# Patient Record
Sex: Female | Born: 1965 | Race: Black or African American | Hispanic: No | Smoking: Never smoker
Health system: Southern US, Community
[De-identification: ages and names within clinical notes are randomized; demographics above are authoritative.]

## PROBLEM LIST (undated history)

## (undated) DIAGNOSIS — E079 Disorder of thyroid, unspecified: Secondary | ICD-10-CM

## (undated) DIAGNOSIS — K5909 Other constipation: Secondary | ICD-10-CM

## (undated) DIAGNOSIS — R87619 Unspecified abnormal cytological findings in specimens from cervix uteri: Secondary | ICD-10-CM

## (undated) DIAGNOSIS — G43109 Migraine with aura, not intractable, without status migrainosus: Secondary | ICD-10-CM

## (undated) HISTORY — DX: Other constipation: K59.09

## (undated) HISTORY — DX: Migraine with aura, not intractable, without status migrainosus: G43.109

## (undated) HISTORY — DX: Disorder of thyroid, unspecified: E07.9

## (undated) HISTORY — DX: Unspecified abnormal cytological findings in specimens from cervix uteri: R87.619

---

## 1992-10-18 HISTORY — PX: DILATION AND CURETTAGE OF UTERUS: SHX78

## 1993-10-18 HISTORY — PX: TUBAL LIGATION: SHX77

## 1999-01-23 ENCOUNTER — Other Ambulatory Visit: Admission: RE | Admit: 1999-01-23 | Discharge: 1999-01-23 | Payer: Self-pay | Admitting: Obstetrics and Gynecology

## 1999-10-19 DIAGNOSIS — R87619 Unspecified abnormal cytological findings in specimens from cervix uteri: Secondary | ICD-10-CM

## 1999-10-19 HISTORY — PX: GYNECOLOGIC CRYOSURGERY: SHX857

## 1999-10-19 HISTORY — DX: Unspecified abnormal cytological findings in specimens from cervix uteri: R87.619

## 2000-02-24 ENCOUNTER — Other Ambulatory Visit: Admission: RE | Admit: 2000-02-24 | Discharge: 2000-02-24 | Payer: Self-pay | Admitting: *Deleted

## 2000-04-14 ENCOUNTER — Other Ambulatory Visit: Admission: RE | Admit: 2000-04-14 | Discharge: 2000-04-14 | Payer: Self-pay | Admitting: *Deleted

## 2000-04-21 ENCOUNTER — Encounter (INDEPENDENT_AMBULATORY_CARE_PROVIDER_SITE_OTHER): Payer: Self-pay

## 2000-04-21 ENCOUNTER — Other Ambulatory Visit: Admission: RE | Admit: 2000-04-21 | Discharge: 2000-04-21 | Payer: Self-pay | Admitting: *Deleted

## 2000-07-29 ENCOUNTER — Other Ambulatory Visit: Admission: RE | Admit: 2000-07-29 | Discharge: 2000-07-29 | Payer: Self-pay | Admitting: *Deleted

## 2001-01-20 ENCOUNTER — Other Ambulatory Visit: Admission: RE | Admit: 2001-01-20 | Discharge: 2001-01-20 | Payer: Self-pay | Admitting: *Deleted

## 2001-08-08 ENCOUNTER — Other Ambulatory Visit: Admission: RE | Admit: 2001-08-08 | Discharge: 2001-08-08 | Payer: Self-pay | Admitting: *Deleted

## 2002-03-21 ENCOUNTER — Other Ambulatory Visit: Admission: RE | Admit: 2002-03-21 | Discharge: 2002-03-21 | Payer: Self-pay | Admitting: *Deleted

## 2002-07-30 ENCOUNTER — Other Ambulatory Visit: Admission: RE | Admit: 2002-07-30 | Discharge: 2002-07-30 | Payer: Self-pay | Admitting: *Deleted

## 2002-10-18 DIAGNOSIS — E079 Disorder of thyroid, unspecified: Secondary | ICD-10-CM

## 2002-10-18 HISTORY — DX: Disorder of thyroid, unspecified: E07.9

## 2003-03-26 ENCOUNTER — Other Ambulatory Visit: Admission: RE | Admit: 2003-03-26 | Discharge: 2003-03-26 | Payer: Self-pay | Admitting: *Deleted

## 2004-04-29 ENCOUNTER — Other Ambulatory Visit: Admission: RE | Admit: 2004-04-29 | Discharge: 2004-04-29 | Payer: Self-pay | Admitting: *Deleted

## 2005-04-02 ENCOUNTER — Other Ambulatory Visit: Admission: RE | Admit: 2005-04-02 | Discharge: 2005-04-02 | Payer: Self-pay | Admitting: *Deleted

## 2005-08-10 ENCOUNTER — Encounter: Admission: RE | Admit: 2005-08-10 | Discharge: 2005-08-10 | Payer: Self-pay | Admitting: Gastroenterology

## 2006-03-30 ENCOUNTER — Other Ambulatory Visit: Admission: RE | Admit: 2006-03-30 | Discharge: 2006-03-30 | Payer: Self-pay | Admitting: *Deleted

## 2007-03-28 ENCOUNTER — Other Ambulatory Visit: Admission: RE | Admit: 2007-03-28 | Discharge: 2007-03-28 | Payer: Self-pay | Admitting: *Deleted

## 2008-04-22 ENCOUNTER — Other Ambulatory Visit: Admission: RE | Admit: 2008-04-22 | Discharge: 2008-04-22 | Payer: Self-pay | Admitting: Family Medicine

## 2008-04-29 ENCOUNTER — Encounter: Admission: RE | Admit: 2008-04-29 | Discharge: 2008-04-29 | Payer: Self-pay | Admitting: Family Medicine

## 2008-05-17 ENCOUNTER — Ambulatory Visit (HOSPITAL_COMMUNITY): Admission: RE | Admit: 2008-05-17 | Discharge: 2008-05-17 | Payer: Self-pay | Admitting: Family Medicine

## 2009-04-03 ENCOUNTER — Other Ambulatory Visit: Admission: RE | Admit: 2009-04-03 | Discharge: 2009-04-03 | Payer: Self-pay | Admitting: Family Medicine

## 2010-04-10 ENCOUNTER — Other Ambulatory Visit: Admission: RE | Admit: 2010-04-10 | Discharge: 2010-04-10 | Payer: Self-pay | Admitting: Family Medicine

## 2010-11-08 ENCOUNTER — Encounter: Payer: Self-pay | Admitting: Gastroenterology

## 2011-11-02 ENCOUNTER — Other Ambulatory Visit: Payer: Self-pay | Admitting: Family Medicine

## 2011-11-02 ENCOUNTER — Other Ambulatory Visit (HOSPITAL_COMMUNITY)
Admission: RE | Admit: 2011-11-02 | Discharge: 2011-11-02 | Disposition: A | Discharge status: Home / Self Care | Payer: Self-pay | Source: Ambulatory Visit | Attending: Family Medicine | Admitting: Family Medicine

## 2011-11-02 DIAGNOSIS — Z01419 Encounter for gynecological examination (general) (routine) without abnormal findings: Secondary | ICD-10-CM | POA: Insufficient documentation

## 2012-08-18 ENCOUNTER — Ambulatory Visit: Payer: Self-pay | Admitting: Gynecology

## 2012-08-18 ENCOUNTER — Ambulatory Visit (INDEPENDENT_AMBULATORY_CARE_PROVIDER_SITE_OTHER): Payer: 59 | Admitting: Gynecology

## 2012-08-18 ENCOUNTER — Encounter: Payer: Self-pay | Admitting: Gynecology

## 2012-08-18 VITALS — BP 130/84 | Ht 67.0 in | Wt 170.0 lb

## 2012-08-18 DIAGNOSIS — G43909 Migraine, unspecified, not intractable, without status migrainosus: Secondary | ICD-10-CM | POA: Insufficient documentation

## 2012-08-18 DIAGNOSIS — N879 Dysplasia of cervix uteri, unspecified: Secondary | ICD-10-CM | POA: Insufficient documentation

## 2012-08-18 DIAGNOSIS — M8580 Other specified disorders of bone density and structure, unspecified site: Secondary | ICD-10-CM

## 2012-08-18 DIAGNOSIS — N926 Irregular menstruation, unspecified: Secondary | ICD-10-CM

## 2012-08-18 DIAGNOSIS — N898 Other specified noninflammatory disorders of vagina: Secondary | ICD-10-CM

## 2012-08-18 DIAGNOSIS — M899 Disorder of bone, unspecified: Secondary | ICD-10-CM

## 2012-08-18 LAB — WET PREP FOR TRICH, YEAST, CLUE
Trich, Wet Prep: NONE SEEN
WBC, Wet Prep HPF POC: NONE SEEN
Yeast Wet Prep HPF POC: NONE SEEN

## 2012-08-18 LAB — TSH: TSH: 1.509 u[IU]/mL (ref 0.350–4.500)

## 2012-08-18 MED ORDER — METRONIDAZOLE 500 MG PO TABS: 500.0000 mg | ORAL_TABLET | Freq: Two times a day (BID) | ORAL | Status: DC

## 2012-08-18 NOTE — Progress Notes (Addendum)
Shelly Castaneda 1966/04/29 [id number]        46 y.o.  [id number] presents in consultation Who normally receives her routine GYN care through her primary physician most recent Pap smear January 2013. History of tubal sterilization a number of years ago. Had heavy menses for which she started on Depo-Provera after her tubal sterilization and received this from 1997 through 2009. She subsequently had no menstruation/bleeding until August 2013 when she had 3 weeks of bleeding with associated abdominal bloating/discomfort which now has resolved. No hot flushes night sweats nipple discharge skin hair changes. Has noticed some weight increase although notes increased appetite also. Also notes vaginal discharge with odor. History of bacterial vaginosis in the past.  Past medical history,surgical history, medications, allergies, family history and social history were all reviewed and documented in the EPIC chart. ROS:  Was performed and pertinent positives and negatives are included in the history.  Exam: Kim assistant Filed Vitals:   08/18/12 1100  BP: 130/84  Height: 5\' 7"  (1.702 m)  Weight: 170 lb (77.111 kg)   General appearance  Normal Skin grossly normal Head/Neck normal with no cervical or supraclavicular adenopathy thyroid normal Lungs  clear Cardiac RR, without RMG Abdominal  soft, nontender, without masses, organomegaly or hernia Breasts  examined lying and sitting without masses, retractions, discharge or axillary adenopathy. Pelvic  Ext/BUS/vagina  normal with heavy white discharge  Cervix  normal   Uterus  anteverted, normal size, shape and contour, midline and mobile nontender   Adnexa  Without masses or tenderness    Anus and perineum  normal   Rectovaginal  normal sphincter tone without palpated masses or tenderness.    Assessment/Plan:  46 y.o. [id number] female referred for consultation.  1. Above history of prolonged Depo-Provera, subsequent amenorrhea for 4 years and then episode  of 3 weeks of bleeding. No overt symptoms to suggest menopause. Some weight gain. Is being treated for hypothyroidism.  We'll check baseline labs to include FSH TSH prolactin. Start with sonohysterogram for structural abnormalities and endometrial sample. 2. Vaginal discharge. Exam and wet prep consistent with BV. We'll treat with Flagyl 500 mg twice a day x7 days. Alcohol points reviewed. Patient does relate that previously Dr. Randell Patient usually gave her oral and vaginal treatments simultaneously with metronidazole and MetroGel. I've asked her the oral Flagyl does not completely eradicate her symptoms to call and we will follow up with a vaginal treatment. 3. Low bone mass. Patient had DEXA in 2009 which showed a Z score -1.1. Repeat DEXA now and patient will schedule. 4. Pap smear. Pap January 2013 normal. Does have history of cryosurgery 2001. Recommend continued screening less frequent interval every 3-5 years. 5. Mammography. Patient overdue and I strongly her chart to schedule this and she understands and agrees to do so. SBE monthly reviewed. 6. Health maintenance. Patient will continue to see her primary physician for routine health care.    Dara Lords MD, 11:47 AM 08/18/2012

## 2012-08-18 NOTE — Patient Instructions (Signed)
Follow up for ultrasound and bone density    Call to Schedule your mammogram  Facilities in [place]: 1)  [facility] [place], [place] Phone: [phone] 2)  [facility], [phone] Phone: [phone] 3)  Dr. Yolanda Bonine at [facility] [address] Phone: [phone]     Mammogram A mammogram is an X-ray test to find changes in a woman's breast. You should get a mammogram if:  You are 35 years of age or older  You have risk factors.   Your doctor recommends that you have one.  BEFORE THE TEST  Do not schedule the test the week before your period, especially if your breasts are sore during this time.  On the day of your mammogram:  Wash your breasts and armpits well. After washing, do not put on any deodorant or talcum powder on until after your test.   Eat and drink as you usually do.   Take your medicines as usual.   If you are diabetic and take insulin, make sure you:   Eat before coming for your test.   Take your insulin as usual.   If you cannot keep your appointment, call before the appointment to cancel. Schedule another appointment.  TEST  You will need to undress from the waist up. You will put on a hospital gown.   Your breast will be put on the mammogram machine, and it will press firmly on your breast with a piece of plastic called a compression paddle. This will make your breast flatter so that the machine can X-ray all parts of your breast.   Both breasts will be X-rayed. Each breast will be X-rayed from above and from the side. An X-ray might need to be taken again if the picture is not good enough.   The mammogram will last about 15 to 30 minutes.  AFTER THE TEST Finding out the results of your test Ask when your test results will be ready. Make sure you get your test results.  Document Released: 12/31/2008 Document Revised: 09/23/2011  Document Reviewed: 12/31/2008 [facility] Patient Information 2012 [place], [place].

## 2012-08-19 LAB — URINALYSIS W MICROSCOPIC + REFLEX CULTURE
Bacteria, UA: NONE SEEN
Bilirubin Urine: NEGATIVE
Casts: NONE SEEN
Crystals: NONE SEEN
Glucose, UA: NEGATIVE mg/dL
Hgb urine dipstick: NEGATIVE
Ketones, ur: NEGATIVE mg/dL
Leukocytes, UA: NEGATIVE
Nitrite: NEGATIVE
Protein, ur: NEGATIVE mg/dL
Specific Gravity, Urine: 1.02 (ref 1.005–1.030)
Urobilinogen, UA: 0.2 mg/dL (ref 0.0–1.0)
pH: 6.5 (ref 5.0–8.0)

## 2012-08-19 LAB — PROLACTIN: Prolactin: 6.7 ng/mL

## 2012-08-19 LAB — FOLLICLE STIMULATING HORMONE: FSH: 84.5 m[IU]/mL

## 2012-08-25 ENCOUNTER — Telehealth: Payer: Self-pay | Admitting: *Deleted

## 2012-08-25 MED ORDER — METRONIDAZOLE 0.75 % VA GEL: 1.0000 | Freq: Two times a day (BID) | VAGINAL | Status: DC

## 2012-08-25 NOTE — Telephone Encounter (Signed)
Pt was given Flagyl 500 mg twice a day x7 days on 08/18/12 . Pt has taken all Rx and still has vaginal discharge, she was told to call if this should continue and another Rx will be given. Please advise

## 2012-08-25 NOTE — Telephone Encounter (Signed)
Pt informed with the below. 

## 2012-08-25 NOTE — Telephone Encounter (Signed)
MetroGel twice daily x5 days

## 2012-09-01 ENCOUNTER — Other Ambulatory Visit: Payer: 59

## 2012-09-01 ENCOUNTER — Ambulatory Visit: Payer: 59 | Admitting: Gynecology

## 2012-09-04 ENCOUNTER — Encounter: Payer: Self-pay | Admitting: Gynecology

## 2012-09-04 ENCOUNTER — Other Ambulatory Visit: Payer: Self-pay | Admitting: Gynecology

## 2012-09-04 ENCOUNTER — Ambulatory Visit (INDEPENDENT_AMBULATORY_CARE_PROVIDER_SITE_OTHER): Payer: 59

## 2012-09-04 ENCOUNTER — Ambulatory Visit (INDEPENDENT_AMBULATORY_CARE_PROVIDER_SITE_OTHER): Payer: 59 | Admitting: Gynecology

## 2012-09-04 DIAGNOSIS — N926 Irregular menstruation, unspecified: Secondary | ICD-10-CM

## 2012-09-04 DIAGNOSIS — N950 Postmenopausal bleeding: Secondary | ICD-10-CM

## 2012-09-04 DIAGNOSIS — N8300 Follicular cyst of ovary, unspecified side: Secondary | ICD-10-CM

## 2012-09-04 DIAGNOSIS — N9489 Other specified conditions associated with female genital organs and menstrual cycle: Secondary | ICD-10-CM

## 2012-09-04 DIAGNOSIS — R102 Pelvic and perineal pain: Secondary | ICD-10-CM

## 2012-09-04 DIAGNOSIS — R140 Abdominal distension (gaseous): Secondary | ICD-10-CM

## 2012-09-04 DIAGNOSIS — R141 Gas pain: Secondary | ICD-10-CM

## 2012-09-04 DIAGNOSIS — N83339 Acquired atrophy of ovary and fallopian tube, unspecified side: Secondary | ICD-10-CM

## 2012-09-04 MED ORDER — LEVOTHYROXINE SODIUM 100 MCG PO TABS: 100.0000 ug | ORAL_TABLET | Freq: Every day | ORAL | Status: DC

## 2012-09-04 NOTE — Patient Instructions (Signed)
Office will call you with the biopsy results. Assuming negative then follow up in one year for annual exam.

## 2012-09-04 NOTE — Progress Notes (Signed)
Patient presents for sonohysterogram with history of amenorrhea for a number of years preceded by Depo-Provera from 1997 through 2009. She had an episode of bleeding for 3 weeks in August. She is not having menopausal symptoms such as hot flashes, night sweats, vaginal dryness or other symptoms.  Recent blood work showed TSH/prolactin normal and FSH of 84.  Ultrasound shows normal uterine size and echotexture. Endometrial echo 1.6 mm. Right and left ovaries normal. Cul-de-sac negative. Sonohysterogram was performed, sterile technique, easy catheter introduction, good distention with no abnormalities. Endometrial sample taken. Patient tolerated well.  Assessment and plan: Premature menopause, asymptomatic with episode of vaginal bleeding. Ultrasound negative with thin endometrial echo. Patient will follow for biopsy which we discussed will probably be atrophic or inadequate. At this point I would recommend observation as long as no further bleeding then we'll continue to monitor and she'll represent in one year. 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 Based on this history of recommended every 3-5 year screening Pap smears.  I reviewed with her options of HRT and the potential benefit of decreased cardiovascular risk if started early in a younger patient, decrease colon cancer and bone health issues. Risks of thrombosis and breast cancer discussed. The patient does not want to consider HRT. She is comfortable on following for now expectantly and will report any bleeding or if she would develop significant menopausal symptoms.  She did ask if I would refill her Synthroid as her TSH was normal and I went ahead and did this for her.

## 2012-09-07 ENCOUNTER — Ambulatory Visit (INDEPENDENT_AMBULATORY_CARE_PROVIDER_SITE_OTHER): Payer: 59

## 2012-09-07 DIAGNOSIS — M8580 Other specified disorders of bone density and structure, unspecified site: Secondary | ICD-10-CM

## 2012-09-07 DIAGNOSIS — Z13820 Encounter for screening for osteoporosis: Secondary | ICD-10-CM

## 2012-09-07 LAB — DG BONE DENSITY

## 2012-12-16 DIAGNOSIS — Z0271 Encounter for disability determination: Secondary | ICD-10-CM

## 2013-01-07 ENCOUNTER — Ambulatory Visit: Payer: Commercial Managed Care - PPO

## 2013-01-07 ENCOUNTER — Ambulatory Visit (INDEPENDENT_AMBULATORY_CARE_PROVIDER_SITE_OTHER): Payer: Commercial Managed Care - PPO | Admitting: Family Medicine

## 2013-01-07 DIAGNOSIS — S50311A Abrasion of right elbow, initial encounter: Secondary | ICD-10-CM

## 2013-01-07 DIAGNOSIS — M2550 Pain in unspecified joint: Secondary | ICD-10-CM

## 2013-01-07 DIAGNOSIS — M79609 Pain in unspecified limb: Secondary | ICD-10-CM

## 2013-01-07 DIAGNOSIS — IMO0002 Reserved for concepts with insufficient information to code with codable children: Secondary | ICD-10-CM

## 2013-01-07 DIAGNOSIS — R079 Chest pain, unspecified: Secondary | ICD-10-CM

## 2013-01-07 DIAGNOSIS — M25519 Pain in unspecified shoulder: Secondary | ICD-10-CM

## 2013-01-07 MED ORDER — MELOXICAM 7.5 MG PO TABS: 7.5000 mg | ORAL_TABLET | Freq: Every day | ORAL | Status: DC

## 2013-01-07 MED ORDER — SILVER SULFADIAZINE 1 % EX CREA: TOPICAL_CREAM | Freq: Every day | CUTANEOUS | Status: DC

## 2013-01-07 MED ORDER — HYDROCODONE-ACETAMINOPHEN 5-325 MG PO TABS: 1.0000 | ORAL_TABLET | Freq: Four times a day (QID) | ORAL | Status: DC | PRN
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
## 2013-01-08 ENCOUNTER — Other Ambulatory Visit: Payer: Self-pay | Admitting: Radiology

## 2013-01-08 NOTE — Telephone Encounter (Signed)
Request has been given to xray

## 2013-01-08 NOTE — Telephone Encounter (Signed)
Patient wants to pick up CD of xrays.

## 2013-05-29 ENCOUNTER — Emergency Department (HOSPITAL_COMMUNITY)
Admission: EM | Admit: 2013-05-29 | Discharge: 2013-05-29 | Disposition: A | Discharge status: Home / Self Care | Payer: 59 | Source: Home / Self Care

## 2013-05-29 ENCOUNTER — Encounter (HOSPITAL_COMMUNITY): Payer: Self-pay | Admitting: Emergency Medicine

## 2013-05-29 DIAGNOSIS — J069 Acute upper respiratory infection, unspecified: Secondary | ICD-10-CM

## 2013-05-29 DIAGNOSIS — J029 Acute pharyngitis, unspecified: Secondary | ICD-10-CM

## 2013-05-29 LAB — POCT RAPID STREP A: Streptococcus, Group A Screen (Direct): NEGATIVE

## 2013-05-29 MED ORDER — DEXAMETHASONE SODIUM PHOSPHATE 10 MG/ML IJ SOLN
INTRAMUSCULAR | Status: AC
  Filled 2013-05-29: qty 1

## 2013-05-29 MED ORDER — CHLORPHENIRAMINE-PSE-IBUPROFEN 2-30-200 MG PO TABS: ORAL_TABLET | ORAL | Status: DC

## 2013-05-29 MED ORDER — FLUTICASONE PROPIONATE 50 MCG/ACT NA SUSP: NASAL | Status: DC

## 2013-05-29 MED ORDER — DEXAMETHASONE SODIUM PHOSPHATE 10 MG/ML IJ SOLN
10.0000 mg | Freq: Once | INTRAMUSCULAR | Status: AC
  Administered 2013-05-29: 10 mg via INTRAMUSCULAR

## 2013-05-29 NOTE — ED Notes (Signed)
C/o sore throat. Sinus pressure and pain/headache.  With a low grade temp. Symptoms present since Saturday.  Gradually getting worse. Pt has used otc meds with no relief in symptoms.

## 2013-05-29 NOTE — ED Provider Notes (Signed)
CSN: [id number]     Arrival date & time 05/29/13  0807 History     None    Chief Complaint  Patient presents with  . Sore Throat   (Consider location/radiation/quality/duration/timing/severity/associated sxs/prior Treatment) HPI Comments: 47 year old female presents complaining of sore throat, subjective fever and chills, sinus headache, and sinus drainage for the past 4 days. She also admits to a fullness sensation in the ears. This started this past Saturday. Her daughter came over to borrow some medicine because she was sick with a similar illness. This is not getting any better or worse, staying about the same. She has not tried taking any over-the-counter medicines to help her symptoms. She denies cough, chest pain, shortness of breath.  Patient is a 47 y.o. female presenting with pharyngitis.  Sore Throat Associated symptoms include headaches. Pertinent negatives include no chest pain, no abdominal pain and no shortness of breath.    Past Medical History  Diagnosis Date  . Thyroid disease   . Migraines    Past Surgical History  Procedure Laterality Date  . Tubal ligation    . Gynecologic cryosurgery  2001   Family History  Problem Relation Age of Onset  . Diabetes Mother   . Thyroid disease Mother   . Dementia Mother   . Stroke Mother   . Hypertension Mother   . Aneurysm Father   . Heart attack Father   . Hypertension Sister   . Hypertension Brother    History  Substance Use Topics  . Smoking status: Never Smoker   . Smokeless tobacco: Not on file  . Alcohol Use: 1.5 oz/week    3 drink(s) per week   OB History   Grav Para Term Preterm Abortions TAB SAB Ect Mult Living   3 1 1  2 1  1  1      Review of Systems  Constitutional: Negative for fever and chills.  HENT: Positive for congestion, sore throat, postnasal drip and sinus pressure. Negative for ear pain.   Eyes: Negative for visual disturbance.  Respiratory: Negative for cough and shortness of breath.    Cardiovascular: Negative for chest pain, palpitations and leg swelling.  Gastrointestinal: Negative for nausea, vomiting and abdominal pain.  Endocrine: Negative for polydipsia and polyuria.  Genitourinary: Negative for dysuria, urgency and frequency.  Musculoskeletal: Negative for myalgias and arthralgias.  Skin: Negative for rash.  Neurological: Positive for headaches. Negative for dizziness, weakness and light-headedness.    Allergies  Review of patient's allergies indicates no known allergies.  Home Medications   Current Outpatient Rx  Name  Route  Sig  Dispense  Refill  . BIOTIN PO   Oral   Take by mouth.         . Cholecalciferol (VITAMIN D PO)   Oral   Take by mouth.         . COD LIVER OIL PO   Oral   Take by mouth.         . levothyroxine (SYNTHROID, LEVOTHROID) 100 MCG tablet   Oral   Take 1 tablet (100 mcg total) by mouth daily.   90 tablet   3   . meloxicam (MOBIC) 7.5 MG tablet   Oral   Take 1 tablet (7.5 mg total) by mouth daily.   30 tablet   0   . Chlorpheniramine-PSE-Ibuprofen (ADVIL ALLERGY SINUS) 2-30-200 MG TABS      2 tabs QID PRN   84 each   0   . fluticasone (FLONASE) 50  MCG/ACT nasal spray      2 sprays/nostril QD   16 g   2   . HYDROcodone-acetaminophen (NORCO) 5-325 MG per tablet   Oral   Take 1 tablet by mouth every 6 (six) hours as needed for pain.   30 tablet   0   . metroNIDAZOLE (FLAGYL) 500 MG tablet   Oral   Take 1 tablet (500 mg total) by mouth 2 (two) times daily. For 7 days.  Avoid alcohol while taking   14 tablet   0   . metroNIDAZOLE (METROGEL VAGINAL) 0.75 % vaginal gel   Vaginal   Place 1 Applicatorful vaginally 2 (two) times daily. For 5 days   70 g   0   . silver sulfADIAZINE (SILVADENE) 1 % cream   Topical   Apply topically daily.   50 g   0    BP 137/94  Pulse 62  Temp(Src) 98.8 F (37.1 C) (Oral)  Resp 16  SpO2 100% Physical Exam  Nursing note and vitals reviewed. Constitutional:  She is oriented to person, place, and time. Vital signs are normal. She appears well-developed and well-nourished. No distress.  HENT:  Head: Normocephalic and atraumatic.  Right Ear: Hearing, tympanic membrane, external ear and ear canal normal.  Left Ear: Hearing, tympanic membrane, external ear and ear canal normal.  Nose: Nose normal.  Mouth/Throat: Uvula is midline. Posterior oropharyngeal erythema (mild) present. No oropharyngeal exudate, posterior oropharyngeal edema or tonsillar abscesses.  Off the lateral nasal mucosa of the left nostril, there is an area of swelling versus large nasal polyp  Eyes: EOM are normal. Pupils are equal, round, and reactive to light.  Cardiovascular: Normal rate, regular rhythm and normal heart sounds.  Exam reveals no gallop and no friction rub.   No murmur heard. Pulmonary/Chest: Effort normal and breath sounds normal. No respiratory distress. She has no wheezes. She has no rales.  Neurological: She is alert and oriented to person, place, and time. She has normal strength.  Skin: Skin is warm and dry. She is not diaphoretic.  Psychiatric: She has a normal mood and affect. Her behavior is normal. Judgment normal.    ED Course   Procedures (including critical care time)  Labs Reviewed  POCT RAPID STREP A ([facility]RG CARE ONLY)   No results found. 1. URI (upper respiratory infection)   2. Pharyngitis     MDM  Viral upper respiratory infection. Rapid strep is negative. Treat symptomatically, followup if not improving.   Meds ordered this encounter  Medications  . dexamethasone (DECADRON) injection 10 mg    Sig:   . Chlorpheniramine-PSE-Ibuprofen (ADVIL ALLERGY SINUS) 2-30-200 MG TABS    Sig: 2 tabs QID PRN    Dispense:  84 each    Refill:  0  . fluticasone (FLONASE) 50 MCG/ACT nasal spray    Sig: 2 sprays/nostril QD    Dispense:  16 g    Refill:  2     Graylon Good, PA-C 05/29/13 [phone]

## 2013-06-01 LAB — CULTURE, GROUP A STREP

## 2013-06-02 NOTE — ED Provider Notes (Signed)
Medical screening examination/treatment/procedure(s) were performed by resident physician or non-physician practitioner and as supervising physician I was immediately available for consultation/collaboration.   Salar Molden DOUGLAS MD.   Ranell Skibinski D Shasta Chinn, MD 06/02/13 0948 

## 2013-06-21 ENCOUNTER — Other Ambulatory Visit: Payer: Self-pay | Admitting: Gynecology

## 2013-06-21 DIAGNOSIS — Z1231 Encounter for screening mammogram for malignant neoplasm of breast: Secondary | ICD-10-CM

## 2013-06-22 ENCOUNTER — Ambulatory Visit (HOSPITAL_COMMUNITY): Payer: 59

## 2013-06-29 ENCOUNTER — Ambulatory Visit (INDEPENDENT_AMBULATORY_CARE_PROVIDER_SITE_OTHER): Payer: Commercial Managed Care - PPO | Admitting: Women's Health

## 2013-06-29 ENCOUNTER — Encounter: Payer: Self-pay | Admitting: Women's Health

## 2013-06-29 DIAGNOSIS — N898 Other specified noninflammatory disorders of vagina: Secondary | ICD-10-CM

## 2013-06-29 DIAGNOSIS — R350 Frequency of micturition: Secondary | ICD-10-CM

## 2013-06-29 LAB — WET PREP FOR TRICH, YEAST, CLUE
Clue Cells Wet Prep HPF POC: NONE SEEN
Trich, Wet Prep: NONE SEEN
Yeast Wet Prep HPF POC: NONE SEEN

## 2013-06-29 LAB — URINALYSIS W MICROSCOPIC + REFLEX CULTURE
Bilirubin Urine: NEGATIVE
Ketones, ur: NEGATIVE mg/dL
Nitrite: NEGATIVE
Protein, ur: NEGATIVE mg/dL
Urobilinogen, UA: 0.2 mg/dL (ref 0.0–1.0)

## 2013-06-29 NOTE — Progress Notes (Signed)
Patient ID: Shelly Castaneda, female   DOB: 1965-10-21, 47 y.o.   MRN: [id number] Presents with complaint of  urinary frequency, denies pain or burning with urination. Slight vaginal discharge with odor. Postmenopausal with no bleeding/ no HRT. Was seen at primary care last week had a negative wet prep and UA. Denies abdominal pain, nausea, fever. Chronic constipation, did have a bowel movement last week  relieved abdominal pain.  Exam: Appears well. External genitalia within normal limits, speculum exam scant discharge, no odor or erythema noted. Wet prep negative. UA negative. Bimanual no CMT or adnexal fullness or tenderness.  Urinary frequency with negative UA Constipation-chronic  Plan: Constipation reviewed, will try miralax daily until regular bowel movements and decrease use, increase fiber rich foods  to greater than 25 g per day. Vaginal lubricants as needed with intercourse, denies need for HRT, denies menopausal symptoms. Instructed to call if continued problems with urinary frequency. Urine culture pending.

## 2013-07-13 ENCOUNTER — Other Ambulatory Visit: Payer: Self-pay | Admitting: Gynecology

## 2013-07-13 ENCOUNTER — Ambulatory Visit (HOSPITAL_COMMUNITY)
Admission: RE | Admit: 2013-07-13 | Discharge: 2013-07-13 | Disposition: A | Discharge status: Home / Self Care | Payer: 59 | Source: Ambulatory Visit | Attending: Gynecology | Admitting: Gynecology

## 2013-07-13 DIAGNOSIS — Z1231 Encounter for screening mammogram for malignant neoplasm of breast: Secondary | ICD-10-CM

## 2013-10-17 ENCOUNTER — Encounter: Payer: Self-pay | Admitting: Gynecology

## 2013-10-17 ENCOUNTER — Ambulatory Visit (INDEPENDENT_AMBULATORY_CARE_PROVIDER_SITE_OTHER): Payer: Commercial Managed Care - PPO | Admitting: Gynecology

## 2013-10-17 DIAGNOSIS — N898 Other specified noninflammatory disorders of vagina: Secondary | ICD-10-CM

## 2013-10-17 DIAGNOSIS — L659 Nonscarring hair loss, unspecified: Secondary | ICD-10-CM

## 2013-10-17 DIAGNOSIS — R300 Dysuria: Secondary | ICD-10-CM

## 2013-10-17 LAB — CBC WITH DIFFERENTIAL/PLATELET
Eosinophils Absolute: 0.1 10*3/uL (ref 0.0–0.7)
Eosinophils Relative: 1 % (ref 0–5)
HCT: 37.5 % (ref 36.0–46.0)
Lymphocytes Relative: 49 % — ABNORMAL HIGH (ref 12–46)
Lymphs Abs: 3 10*3/uL (ref 0.7–4.0)
MCH: 27.5 pg (ref 26.0–34.0)
MCV: 82.4 fL (ref 78.0–100.0)
Monocytes Absolute: 0.3 10*3/uL (ref 0.1–1.0)
RBC: 4.55 MIL/uL (ref 3.87–5.11)
RDW: 14.3 % (ref 11.5–15.5)
WBC: 6.1 10*3/uL (ref 4.0–10.5)

## 2013-10-17 LAB — URINALYSIS W MICROSCOPIC + REFLEX CULTURE
Casts: NONE SEEN
Leukocytes, UA: NEGATIVE
Nitrite: NEGATIVE
pH: 7 (ref 5.0–8.0)

## 2013-10-17 LAB — WET PREP FOR TRICH, YEAST, CLUE: Yeast Wet Prep HPF POC: NONE SEEN

## 2013-10-17 LAB — THYROID PANEL
Free T4: 1.17 ng/dL (ref 0.80–1.80)
TSH: 0.515 u[IU]/mL (ref 0.350–4.500)

## 2013-10-17 LAB — IRON AND TIBC
TIBC: 286 ug/dL (ref 250–470)
UIBC: 248 ug/dL (ref 125–400)

## 2013-10-17 MED ORDER — METRONIDAZOLE 500 MG PO TABS: 500.0000 mg | ORAL_TABLET | Freq: Two times a day (BID) | ORAL | Status: DC

## 2013-10-17 NOTE — Progress Notes (Signed)
Patient presents with several issues: 1. Patient has noted in the last several months increasing hair loss. She is on thyroid replacement and said they recently adjusted her dose. She's had no skin changes weight changes. Has noted several temporary rashes over her face and neck a week or so ago but they resolved. Remains amenorrheic with [employer] checked last year 73. Not having significant hot flushes, night sweats vaginal dryness or any vaginal bleeding. 2. Patient notes over the last week or 2 vaginal discharge with itching and some mild dysuria. No frequency urgency low back pain.  Exam was [employer] normal without overt evidence of alopecia or hair thinning. Thyroid appears and palpates normal. No cervical adenopathy. Spine straight without CVA tenderness. Abdomen soft nontender without masses guarding rebound organomegaly. Pelvic external BUS vagina with scant white discharge. Cervix normal. Uterus normal size midline mobile nontender. Adnexa without masses or tenderness.  Assessment and plan: 1. Vaginal discharge with itching mild dysuria. Urinalysis is negative. Wet prep consistent with bacterial vaginosis. Will treat with Flagyl 500 mg twice a day x7 days. Alcohol avoidance reviewed and accepted. Followup if symptoms persist, worsen or recur. 2. Hair loss historically over the last several months. Exam is normal. Recent adjustment of her thyroid does historically. We'll check baseline thyroid panel. Also iron studies with CBC. Assuming negative recommended she followup with dermatology for their evaluation. The issues of poor mental input whether this is a perimenopausal symptom for her and whether HRT would be of any benefit also discussed. I reviewed the whole issue of HRT with her to include the WHI study and risks of stroke heart attack DVT and breast cancer. In the absence of other overt symptoms such as hot flushes, night sweats vaginal dryness dyspareunia and whether it would  warrant a trial. We'll further discuss pending lab results and possible dermatology evaluation.

## 2013-10-17 NOTE — Patient Instructions (Signed)
Followup for lab results. If the hair loss continues consider seeing a dermatologist. Start on Flagyl medication twice daily for 7 days. Avoid alcohol while taking. Call if vaginal symptoms persist, worsen or recur.

## 2013-10-19 ENCOUNTER — Other Ambulatory Visit: Payer: Self-pay | Admitting: Gynecology

## 2013-10-19 DIAGNOSIS — D649 Anemia, unspecified: Secondary | ICD-10-CM

## 2013-11-06 ENCOUNTER — Encounter: Payer: Self-pay | Admitting: Women's Health

## 2013-11-06 ENCOUNTER — Ambulatory Visit (INDEPENDENT_AMBULATORY_CARE_PROVIDER_SITE_OTHER): Payer: Commercial Managed Care - PPO | Admitting: Women's Health

## 2013-11-06 DIAGNOSIS — R350 Frequency of micturition: Secondary | ICD-10-CM

## 2013-11-06 DIAGNOSIS — N390 Urinary tract infection, site not specified: Secondary | ICD-10-CM | POA: Insufficient documentation

## 2013-11-06 DIAGNOSIS — N898 Other specified noninflammatory disorders of vagina: Secondary | ICD-10-CM

## 2013-11-06 LAB — URINALYSIS W MICROSCOPIC + REFLEX CULTURE
Bilirubin Urine: NEGATIVE
Casts: NONE SEEN
Crystals: NONE SEEN
Glucose, UA: NEGATIVE mg/dL
KETONES UR: NEGATIVE mg/dL
NITRITE: NEGATIVE
PROTEIN: NEGATIVE mg/dL
Specific Gravity, Urine: 1.01 (ref 1.005–1.030)
UROBILINOGEN UA: 0.2 mg/dL (ref 0.0–1.0)
pH: 6.5 (ref 5.0–8.0)

## 2013-11-06 LAB — WET PREP FOR TRICH, YEAST, CLUE
CLUE CELLS WET PREP: NONE SEEN
TRICH WET PREP: NONE SEEN
WBC WET PREP: NONE SEEN
YEAST WET PREP: NONE SEEN

## 2013-11-06 MED ORDER — SULFAMETHOXAZOLE-TRIMETHOPRIM 800-160 MG PO TABS: 1.0000 | ORAL_TABLET | Freq: Two times a day (BID) | ORAL | Status: DC

## 2013-11-06 MED ORDER — FLUCONAZOLE 150 MG PO TABS: 150.0000 mg | ORAL_TABLET | Freq: Once | ORAL | Status: DC

## 2013-11-06 NOTE — Progress Notes (Signed)
Patient ID: Shelly Castaneda, female   DOB: 12/16/1965, 48 y.o.   MRN: [id number] Presents with the painless hematuria, urinary frequency, bright red urine, strong urine odor, passing blood clots for past 5 days. Denies fever, nausea, vomiting, abdominal pain or lower back pain. Took AZO on Sunday with some relief. Bacterial vaginosis 10/17/2013 treated with metronidazole.  Postmenopausal/no bleeding/no HRT/same partner. FSH 84 in 2013. History of anemia, recently started taking iron pills. History of chronic constipation.  Exam: Appears well. Speculum exam  no blood in vault, no visible discharge or erythema. Wet prep negative. UA:  positive for leukocytes 21-50 WBC, TNTC RBC, few bacteria.   Urinary tract infection hematuria  Plan: Bactrim DS 800-160 mg  twice a day x3 days. Diflucan 150 mg by mouth x1. Prescription proper use given and reviewed. Urine culture pending. Advised to call if symptoms do not improve. Clean-catch UA 2 weeks.

## 2013-11-06 NOTE — Patient Instructions (Signed)
Urinary Tract Infection  Urinary tract infections (UTIs) can develop anywhere along your urinary tract. Your urinary tract is your body's drainage system for removing wastes and extra water. Your urinary tract includes two kidneys, two ureters, a bladder, and a urethra. Your kidneys are a pair of bean-shaped organs. Each kidney is about the size of your fist. They are located below your ribs, one on each side of your spine.  CAUSES  Infections are caused by microbes, which are microscopic organisms, including fungi, viruses, and bacteria. These organisms are so small that they can only be seen through a microscope. Bacteria are the microbes that most commonly cause UTIs.  SYMPTOMS   Symptoms of UTIs may vary by age and gender of the patient and by the location of the infection. Symptoms in [place] women typically include a frequent and intense urge to urinate and a painful, burning feeling in the bladder or urethra during urination. Older women and men are more likely to be tired, shaky, and weak and have muscle aches and abdominal pain. A fever may mean the infection is in your kidneys. Other symptoms of a kidney infection include pain in your back or sides below the ribs, nausea, and vomiting.  DIAGNOSIS  To diagnose a UTI, your caregiver will ask you about your symptoms. Your caregiver also will ask to provide a urine sample. The urine sample will be tested for bacteria and white blood cells. White blood cells are made by your body to help fight infection.  TREATMENT   Typically, UTIs can be treated with medication. Because most UTIs are caused by a bacterial infection, they usually can be treated with the use of antibiotics. The choice of antibiotic and length of treatment depend on your symptoms and the type of bacteria causing your infection.  HOME CARE INSTRUCTIONS   If you were prescribed antibiotics, take them exactly as your caregiver instructs you. Finish the medication even if you feel better after you  have only taken some of the medication.   Drink enough water and fluids to keep your urine clear or pale yellow.   Avoid caffeine, tea, and carbonated beverages. They tend to irritate your bladder.   Empty your bladder often. Avoid holding urine for long periods of time.   Empty your bladder before and after sexual intercourse.   After a bowel movement, women should cleanse from front to back. Use each tissue only once.  SEEK MEDICAL CARE IF:    You have back pain.   You develop a fever.   Your symptoms do not begin to resolve within 3 days.  SEEK IMMEDIATE MEDICAL CARE IF:    You have severe back pain or lower abdominal pain.   You develop chills.   You have nausea or vomiting.   You have continued burning or discomfort with urination.  MAKE SURE YOU:    Understand these instructions.   Will watch your condition.   Will get help right away if you are not doing well or get worse.  Document Released: 07/14/2005 Document Revised: 04/04/2012 Document Reviewed: 11/12/2011  ExitCare Patient Information 2014 [employer].

## 2013-11-08 LAB — URINE CULTURE

## 2013-11-09 ENCOUNTER — Telehealth: Payer: Self-pay | Admitting: *Deleted

## 2013-11-09 NOTE — Telephone Encounter (Signed)
Please call, review urine culture positive for Proteus, Cipro 250 twice daily for 3 days should resolve all symptoms. Sensitivity has not returned from culture. Also need to check a test of cure UA since she had blood in the urine. Also overdue for an annual exam have her schedule, we could check a test of cure UA than..Marland Kitchen

## 2013-11-09 NOTE — Telephone Encounter (Signed)
Pt was seen on 11/06/13 given Rx for bactrim #6 pt still c/o urgency, no pain, no blood in urine. She is drinking water, feels if she may need to continue on more medication today is last day of medication. Pt has a refill on bactrim #6 given by on call MD Dr.Silva if needed. Please advise

## 2013-11-09 NOTE — Telephone Encounter (Signed)
Pt informed with the below note, pt has annual scheduled on 12/07/13 will have recheck u/a then
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
## 2013-12-07 ENCOUNTER — Encounter: Payer: Commercial Managed Care - PPO | Admitting: Gynecology

## 2014-01-04 ENCOUNTER — Encounter: Payer: 59 | Admitting: Gynecology

## 2014-05-08 ENCOUNTER — Other Ambulatory Visit: Payer: Self-pay | Admitting: Gynecology

## 2014-05-08 DIAGNOSIS — Z1231 Encounter for screening mammogram for malignant neoplasm of breast: Secondary | ICD-10-CM

## 2014-06-28 ENCOUNTER — Ambulatory Visit (AMBULATORY_SURGERY_CENTER): Payer: Self-pay | Admitting: *Deleted

## 2014-06-28 VITALS — Ht 66.0 in | Wt 167.6 lb

## 2014-06-28 DIAGNOSIS — Z1211 Encounter for screening for malignant neoplasm of colon: Secondary | ICD-10-CM

## 2014-06-28 MED ORDER — MOVIPREP 100 G PO SOLR: ORAL | Status: DC

## 2014-06-28 NOTE — Progress Notes (Signed)
No allergies to eggs or soy. No problems with anesthesia.  Pt given Emmi instructions for colonoscopy  No oxygen use  No diet drug use  

## 2014-07-05 ENCOUNTER — Ambulatory Visit (AMBULATORY_SURGERY_CENTER): Payer: 59 | Admitting: Internal Medicine

## 2014-07-05 ENCOUNTER — Encounter: Payer: Self-pay | Admitting: Internal Medicine

## 2014-07-05 VITALS — BP 114/54 | HR 56 | Temp 97.4°F | Resp 16 | Ht 66.0 in | Wt 167.0 lb

## 2014-07-05 DIAGNOSIS — Z1211 Encounter for screening for malignant neoplasm of colon: Secondary | ICD-10-CM

## 2014-07-05 MED ORDER — SODIUM CHLORIDE 0.9 % IV SOLN: 500.0000 mL | INTRAVENOUS | Status: DC

## 2014-07-05 NOTE — Patient Instructions (Signed)
YOU HAD AN ENDOSCOPIC PROCEDURE TODAY AT THE [facility]: Refer to the procedure report that was given to you for any specific questions about what was found during the examination.  If the procedure report does not answer your questions, please call your gastroenterologist to clarify.  If you requested that your care partner not be given the details of your procedure findings, then the procedure report has been included in a sealed envelope for you to review at your convenience later.  YOU SHOULD EXPECT: Some feelings of bloating in the abdomen. Passage of more gas than usual.  Walking can help get rid of the air that was put into your GI tract during the procedure and reduce the bloating. If you had a lower endoscopy (such as a colonoscopy or flexible sigmoidoscopy) you may notice spotting of blood in your stool or on the toilet paper. If you underwent a bowel prep for your procedure, then you may not have a normal bowel movement for a few days.  DIET: Your first meal following the procedure should be a light meal and then it is ok to progress to your normal diet.  A half-sandwich or bowl of soup is an example of a good first meal.  Heavy or fried foods are harder to digest and may make you feel nauseous or bloated.  Likewise meals heavy in dairy and vegetables can cause extra gas to form and this can also increase the bloating.  Drink plenty of fluids but you should avoid alcoholic beverages for 24 hours.  ACTIVITY: Your care partner should take you home directly after the procedure.  You should plan to take it easy, moving slowly for the rest of the day.  You can resume normal activity the day after the procedure however you should NOT DRIVE or use heavy machinery for 24 hours (because of the sedation medicines used during the test).    SYMPTOMS TO REPORT IMMEDIATELY: A gastroenterologist can be reached at any hour.  During normal business hours, 8:30 AM to 5:00 PM Monday through Friday,  call [phone].  After hours and on weekends, please call the GI answering service at [phone] who will take a message and have the physician on call contact you.   Following lower endoscopy (colonoscopy or flexible sigmoidoscopy):  Excessive amounts of blood in the stool  Significant tenderness or worsening of abdominal pains  Swelling of the abdomen that is new, acute  Fever of 100F or higher    FOLLOW UP: If any biopsies were taken you will be contacted by phone or by letter within the next 1-3 weeks.  Call your gastroenterologist if you have not heard about the biopsies in 3 weeks.  Our staff will call the home number listed on your records the next business day following your procedure to check on you and address any questions or concerns that you may have at that time regarding the information given to you following your procedure. This is a courtesy call and so if there is no answer at the home number and we have not heard from you through the emergency physician on call, we will assume that you have returned to your regular daily activities without incident.  SIGNATURES/CONFIDENTIALITY: You and/or your care partner have signed paperwork which will be entered into your electronic medical record.  These signatures attest to the fact that that the information above on your After Visit Summary has been reviewed and is understood.  Full responsibility of the confidentiality   of this discharge information lies with you and/or your care-partner.  Diverticulosis and high fiber diet information given.,  Next colonoscopy 5 years-2020.

## 2014-07-05 NOTE — Op Note (Addendum)
[facility] [address] [place], [address]   COLONOSCOPY PROCEDURE REPORT  PATIENT: Shelly, Castaneda  MR#: [id number] BIRTHDATE: 12-16-1965 , 48  yrs. old GENDER: Female ENDOSCOPIST: Hart Carwin, MD REFERRED XB:[id number] Nicholos Johns, M.D. PROCEDURE DATE:  07/05/2014 PROCEDURE:   Colonoscopy, screening , high risk First Screening Colonoscopy - Avg.  risk and is 50 yrs.  old or older Yes.  Prior Negative Screening - Now for repeat screening. N/A  History of Adenoma - Now for follow-up colonoscopy & has been > or = to 3 yrs.  N/A  Polyps Removed Today? No.  Recommend repeat exam, <10 yrs? No. ASA CLASS:    Class I INDICATIONS:   Av  patient's half sister had colon cancer MEDICATIONS: MAC sedation, administered by CRNA and Propofol (Diprivan) 230 mg IV  DESCRIPTION OF PROCEDURE:   After the risks benefits and alternatives of the procedure were thoroughly explained, informed consent was obtained.  A digital rectal exam revealed no abnormalities of the rectum.   The LB [id number]  endoscope was introduced through the anus and advanced to the cecum, which was identified by both the appendix and ileocecal valve. No adverse events experienced.   The quality of the prep was excellent, using MoviPrep  The instrument was then slowly withdrawn as the colon was fully examined.      COLON FINDINGS: A normal appearing cecum, ileocecal valve, and appendiceal orifice were identified.  The ascending, hepatic flexure, transverse, splenic flexure, descending, sigmoid colon and rectum appeared unremarkable.  No polyps or cancers were seen. Retroflexed views revealed no abnormalities. The time to cecum=4 minutes 36 seconds.  Withdrawal time=6 minutes 05 seconds.  The scope was withdrawn and the procedure completed. COMPLICATIONS: There were no complications.  ENDOSCOPIC IMPRESSION: Normal colon 1 single diverticulum next to the ileocecal valve  RECOMMENDATIONS: high  fiber diet Recall colonoscopy in 5 years   eSigned:  Hart Carwin, MD 07/05/2014 10:14 AM Revised: 07/05/2014 10:14 AM  cc:   PATIENT NAME:  Shelly, Castaneda MR#: [id number]

## 2014-07-05 NOTE — Progress Notes (Signed)
Report to PACU, RN, vss, BBS= Clear.  

## 2014-07-08 ENCOUNTER — Telehealth: Payer: Self-pay | Admitting: *Deleted

## 2014-07-08 NOTE — Telephone Encounter (Signed)
  Follow up Call-  Call back number 07/05/2014  Post procedure Call Back phone  # [phone]  Permission to leave phone message Yes     Patient questions:  Do you have a fever, pain , or abdominal swelling? No. Pain Score  0 *  Have you tolerated food without any problems? Yes.    Have you been able to return to your normal activities? Yes.    Do you have any questions about your discharge instructions: Diet   No. Medications  No. Follow up visit  No.  Do you have questions or concerns about your Care? No.  Actions: * If pain score is 4 or above: No action needed, pain <4.

## 2014-07-12 ENCOUNTER — Encounter: Payer: 59 | Admitting: Internal Medicine

## 2014-07-19 ENCOUNTER — Encounter: Payer: 59 | Admitting: Gynecology

## 2014-07-19 ENCOUNTER — Ambulatory Visit (HOSPITAL_COMMUNITY)
Admission: RE | Admit: 2014-07-19 | Discharge: 2014-07-19 | Disposition: A | Discharge status: Home / Self Care | Payer: 59 | Source: Ambulatory Visit | Attending: Gynecology | Admitting: Gynecology

## 2014-07-19 DIAGNOSIS — Z1231 Encounter for screening mammogram for malignant neoplasm of breast: Secondary | ICD-10-CM | POA: Diagnosis present

## 2014-07-25 ENCOUNTER — Encounter: Payer: 59 | Admitting: Gynecology

## 2014-08-07 ENCOUNTER — Encounter: Payer: 59 | Admitting: Gynecology

## 2014-08-19 ENCOUNTER — Encounter: Payer: Self-pay | Admitting: Internal Medicine

## 2014-09-30 ENCOUNTER — Ambulatory Visit: Payer: 59 | Admitting: Neurology

## 2014-12-04 ENCOUNTER — Other Ambulatory Visit (HOSPITAL_COMMUNITY)
Admission: RE | Admit: 2014-12-04 | Discharge: 2014-12-04 | Disposition: A | Discharge status: Home / Self Care | Payer: 59 | Source: Ambulatory Visit | Attending: Family Medicine | Admitting: Family Medicine

## 2014-12-04 ENCOUNTER — Other Ambulatory Visit: Payer: Self-pay | Admitting: Family Medicine

## 2014-12-04 DIAGNOSIS — Z01419 Encounter for gynecological examination (general) (routine) without abnormal findings: Secondary | ICD-10-CM | POA: Insufficient documentation

## 2014-12-06 LAB — CYTOLOGY - PAP

## 2015-03-07 ENCOUNTER — Encounter: Payer: Self-pay | Admitting: Nurse Practitioner

## 2015-03-19 ENCOUNTER — Ambulatory Visit (INDEPENDENT_AMBULATORY_CARE_PROVIDER_SITE_OTHER): Payer: 59 | Admitting: Nurse Practitioner

## 2015-03-19 ENCOUNTER — Encounter: Payer: Self-pay | Admitting: Nurse Practitioner

## 2015-03-19 DIAGNOSIS — R300 Dysuria: Secondary | ICD-10-CM

## 2015-03-19 LAB — POCT URINALYSIS DIPSTICK
Bilirubin, UA: NEGATIVE
GLUCOSE UA: NEGATIVE
Ketones, UA: NEGATIVE
Leukocytes, UA: NEGATIVE
Nitrite, UA: NEGATIVE
PH UA: 5
Protein, UA: NEGATIVE
Urobilinogen, UA: NEGATIVE

## 2015-03-19 MED ORDER — NITROFURANTOIN MONOHYD MACRO 100 MG PO CAPS: 100.0000 mg | ORAL_CAPSULE | Freq: Two times a day (BID) | ORAL | Status: DC

## 2015-03-19 MED ORDER — NONFORMULARY OR COMPOUNDED ITEM: Status: DC

## 2015-03-19 NOTE — Patient Instructions (Addendum)
EXERCISE AND DIET:  We recommended that you start or continue a regular exercise program for good health. Regular exercise means any activity that makes your heart beat faster and makes you sweat.  We recommend exercising at least 30 minutes per day at least 3 days a week, preferably 4 or 5.  We also recommend a diet low in fat and sugar.  Inactivity, poor dietary choices and obesity can cause diabetes, heart attack, stroke, and kidney damage, among others.    ALCOHOL AND SMOKING:  Women should limit their alcohol intake to no more than 7 drinks/beers/glasses of wine (combined, not each!) per week. Moderation of alcohol intake to this level decreases your risk of breast cancer and liver damage. And of course, no recreational drugs are part of a healthy lifestyle.  And absolutely no smoking or even second hand smoke. Most people know smoking can cause heart and lung diseases, but did you know it also contributes to weakening of your bones? Aging of your skin?  Yellowing of your teeth and nails?  CALCIUM AND VITAMIN D:  Adequate intake of calcium and Vitamin D are recommended.  The recommendations for exact amounts of these supplements seem to change often, but generally speaking 600 mg of calcium (either carbonate or citrate) and 800 units of Vitamin D per day seems prudent. Certain women may benefit from higher intake of Vitamin D.  If you are among these women, your doctor will have told you during your visit.    PAP SMEARS:  Pap smears, to check for cervical cancer or precancers,  have traditionally been done yearly, although recent scientific advances have shown that most women can have pap smears less often.  However, every woman still should have a physical exam from her gynecologist every year. It will include a breast check, inspection of the vulva and vagina to check for abnormal growths or skin changes, a visual exam of the cervix, and then an exam to evaluate the size and shape of the uterus and  ovaries.  And after 51 years of age, a rectal exam is indicated to check for rectal cancers. We will also provide age appropriate advice regarding health maintenance, like when you should have certain vaccines, screening for sexually transmitted diseases, bone density testing, colonoscopy, mammograms, etc.   MAMMOGRAMS:  All women over 40 years old should have a yearly mammogram. Many facilities now offer a "3D" mammogram, which may cost around $50 extra out of pocket. If possible,  we recommend you accept the option to have the 3D mammogram performed.  It both reduces the number of women who will be called back for extra views which then turn out to be normal, and it is better than the routine mammogram at detecting truly abnormal areas.    COLONOSCOPY:  Colonoscopy to screen for colon cancer is recommended for all women at age 50.  We know, you hate the idea of the prep.  We agree, BUT, having colon cancer and not knowing it is worse!!  Colon cancer so often starts as a polyp that can be seen and removed at colonscopy, which can quite literally save your life!  And if your first colonoscopy is normal and you have no family history of colon cancer, most women don't have to have it again for 10 years.  Once every ten years, you can do something that may end up saving your life, right?  We will be happy to help you get it scheduled when you are ready.    Be sure to check your insurance coverage so you understand how much it will cost.  It may be covered as a preventative service at no cost, but you should check your particular policy.     Urinary Tract Infection Urinary tract infections (UTIs) can develop anywhere along your urinary tract. Your urinary tract is your body's drainage system for removing wastes and extra water. Your urinary tract includes two kidneys, two ureters, a bladder, and a urethra. Your kidneys are a pair of bean-shaped organs. Each kidney is about the size of your fist. They are located  below your ribs, one on each side of your spine. CAUSES Infections are caused by microbes, which are microscopic organisms, including fungi, viruses, and bacteria. These organisms are so small that they can only be seen through a microscope. Bacteria are the microbes that most commonly cause UTIs. SYMPTOMS  Symptoms of UTIs may vary by age and gender of the patient and by the location of the infection. Symptoms in young women typically include a frequent and intense urge to urinate and a painful, burning feeling in the bladder or urethra during urination. Older women and men are more likely to be tired, shaky, and weak and have muscle aches and abdominal pain. A fever may mean the infection is in your kidneys. Other symptoms of a kidney infection include pain in your back or sides below the ribs, nausea, and vomiting. DIAGNOSIS To diagnose a UTI, your caregiver will ask you about your symptoms. Your caregiver also will ask to provide a urine sample. The urine sample will be tested for bacteria and white blood cells. White blood cells are made by your body to help fight infection. TREATMENT  Typically, UTIs can be treated with medication. Because most UTIs are caused by a bacterial infection, they usually can be treated with the use of antibiotics. The choice of antibiotic and length of treatment depend on your symptoms and the type of bacteria causing your infection. HOME CARE INSTRUCTIONS  If you were prescribed antibiotics, take them exactly as your caregiver instructs you. Finish the medication even if you feel better after you have only taken some of the medication.  Drink enough water and fluids to keep your urine clear or pale yellow.  Avoid caffeine, tea, and carbonated beverages. They tend to irritate your bladder.  Empty your bladder often. Avoid holding urine for long periods of time.  Empty your bladder before and after sexual intercourse.  After a bowel movement, women should cleanse  from front to back. Use each tissue only once. SEEK MEDICAL CARE IF:   You have back pain.  You develop a fever.  Your symptoms do not begin to resolve within 3 days. SEEK IMMEDIATE MEDICAL CARE IF:   You have severe back pain or lower abdominal pain.  You develop chills.  You have nausea or vomiting.  You have continued burning or discomfort with urination. MAKE SURE YOU:   Understand these instructions.  Will watch your condition.  Will get help right away if you are not doing well or get worse. Document Released: 07/14/2005 Document Revised: 04/04/2012 Document Reviewed: 11/12/2011 ExitCare Patient Information 2015 [employer]. This information is not intended to replace advice given to you by your health care provider. Make sure you discuss any questions you have with your health care provider.  
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
## 2015-03-20 LAB — URINALYSIS, MICROSCOPIC ONLY
Bacteria, UA: NONE SEEN
Casts: NONE SEEN
Crystals: NONE SEEN
Squamous Epithelial / LPF: NONE SEEN

## 2015-03-21 LAB — URINE CULTURE
Colony Count: NO GROWTH
ORGANISM ID, BACTERIA: NO GROWTH

## 2015-03-23 NOTE — Progress Notes (Signed)
Encounter reviewed by Dr. Bailley Guilford Silva.  

## 2015-04-03 ENCOUNTER — Telehealth: Payer: Self-pay | Admitting: Nurse Practitioner
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
## 2015-04-03 NOTE — Telephone Encounter (Signed)
Patient called and said, "I am continuing to have urinary tract infection symptoms and Patty told me to come back in if that happened." Patient declined to schedule an appointment until next Friday, 04/11/15. I requested she wait to schedule until a nurse can assess her over the phone.

## 2015-04-04 NOTE — Telephone Encounter (Signed)
I think an appt would be good.  Can try Vesicare 5mg  to see if will help.  May need urology evaluation but can see if anticholinergic helps.  Side effects:  Dry eyes, dry mouth, constipation.  Confirm no hx of glaucoma.

## 2015-04-04 NOTE — Telephone Encounter (Signed)
Message left to return call to Carlesha Seiple at [phone].    

## 2015-04-07 MED ORDER — SOLIFENACIN SUCCINATE 5 MG PO TABS: 5.0000 mg | ORAL_TABLET | Freq: Every day | ORAL | Status: DC

## 2015-04-07 NOTE — Telephone Encounter (Signed)
Spoke with patient. No change in symptoms, not worse or better. She is agreeable to trial of vesicare and confirms no history of Glaucoma. Side effects discussed. Patient verbalized understanding and will call office with any concerns. She is given appointment with Dr. Hyacinth Meeker for Friday for evaluation. She is advised to call back with any concerns prior to appointment. Routing to provider for final review. Patient agreeable to disposition. Will close encounter.

## 2015-04-11 ENCOUNTER — Ambulatory Visit (INDEPENDENT_AMBULATORY_CARE_PROVIDER_SITE_OTHER): Payer: 59 | Admitting: Obstetrics & Gynecology

## 2015-04-11 ENCOUNTER — Other Ambulatory Visit: Payer: Self-pay | Admitting: Obstetrics & Gynecology

## 2015-04-11 ENCOUNTER — Encounter: Payer: Self-pay | Admitting: Obstetrics & Gynecology

## 2015-04-11 VITALS — BP 120/82 | HR 64 | Resp 16 | Wt 161.0 lb

## 2015-04-11 DIAGNOSIS — B3731 Acute candidiasis of vulva and vagina: Secondary | ICD-10-CM

## 2015-04-11 DIAGNOSIS — R3915 Urgency of urination: Secondary | ICD-10-CM | POA: Diagnosis not present

## 2015-04-11 DIAGNOSIS — B373 Candidiasis of vulva and vagina: Secondary | ICD-10-CM | POA: Diagnosis not present

## 2015-04-11 DIAGNOSIS — R300 Dysuria: Secondary | ICD-10-CM | POA: Diagnosis not present

## 2015-04-11 DIAGNOSIS — M549 Dorsalgia, unspecified: Secondary | ICD-10-CM

## 2015-04-11 MED ORDER — FLUCONAZOLE 150 MG PO TABS: 150.0000 mg | ORAL_TABLET | Freq: Once | ORAL | Status: DC
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
## 2015-04-12 LAB — URINE CULTURE
COLONY COUNT: NO GROWTH
Organism ID, Bacteria: NO GROWTH

## 2015-04-13 DIAGNOSIS — R300 Dysuria: Secondary | ICD-10-CM | POA: Insufficient documentation

## 2015-04-13 DIAGNOSIS — M549 Dorsalgia, unspecified: Secondary | ICD-10-CM | POA: Insufficient documentation

## 2015-04-23 ENCOUNTER — Telehealth: Payer: Self-pay

## 2015-04-23 NOTE — Telephone Encounter (Signed)
Lmtcb//kn 

## 2015-04-23 NOTE — Telephone Encounter (Signed)
-----   Message from Jerene BearsMary S Miller, MD sent at 04/22/2015  3:11 AM EDT ----- Paper results have already been signed off.

## 2015-05-06 NOTE — Telephone Encounter (Signed)
Lmtcb//kn 

## 2015-05-08 NOTE — Telephone Encounter (Signed)
Lmtcb//kn 

## 2015-05-27 ENCOUNTER — Ambulatory Visit: Payer: 59 | Admitting: Certified Nurse Midwife

## 2015-05-27 ENCOUNTER — Ambulatory Visit (INDEPENDENT_AMBULATORY_CARE_PROVIDER_SITE_OTHER): Payer: 59 | Admitting: Certified Nurse Midwife

## 2015-05-27 ENCOUNTER — Encounter: Payer: Self-pay | Admitting: Certified Nurse Midwife

## 2015-05-27 VITALS — BP 118/80 | HR 72 | Temp 98.1°F | Resp 20 | Ht 66.25 in | Wt 159.0 lb

## 2015-05-27 DIAGNOSIS — N390 Urinary tract infection, site not specified: Secondary | ICD-10-CM

## 2015-05-27 DIAGNOSIS — Z113 Encounter for screening for infections with a predominantly sexual mode of transmission: Secondary | ICD-10-CM

## 2015-05-27 DIAGNOSIS — R3915 Urgency of urination: Secondary | ICD-10-CM | POA: Diagnosis not present

## 2015-05-27 DIAGNOSIS — R319 Hematuria, unspecified: Secondary | ICD-10-CM | POA: Diagnosis not present

## 2015-05-27 LAB — POCT URINALYSIS DIPSTICK
Bilirubin, UA: NEGATIVE
GLUCOSE UA: NEGATIVE
KETONES UA: NEGATIVE
Leukocytes, UA: NEGATIVE
Nitrite, UA: NEGATIVE
PH UA: 5
Protein, UA: NEGATIVE
Urobilinogen, UA: NEGATIVE

## 2015-05-27 MED ORDER — PHENAZOPYRIDINE HCL 200 MG PO TABS: 200.0000 mg | ORAL_TABLET | Freq: Three times a day (TID) | ORAL | Status: DC | PRN
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
## 2015-05-27 NOTE — Patient Instructions (Signed)
Back Pain, Adult Back pain is very common. The pain often gets better over time. The cause of back pain is usually not dangerous. Most people can learn to manage their back pain on their own.  HOME CARE   Stay active. Start with short walks on flat ground if you can. Try to walk farther each day.  Do not sit, drive, or stand in one place for more than 30 minutes. Do not stay in bed.  Do not avoid exercise or work. Activity can help your back heal faster.  Be careful when you bend or lift an object. Bend at your knees, keep the object close to you, and do not twist.  Sleep on a firm mattress. Lie on your side, and bend your knees. If you lie on your back, put a pillow under your knees.  Only take medicines as told by your doctor.  Put ice on the injured area.  Put ice in a plastic bag.  Place a towel between your skin and the bag.  Leave the ice on for 15-20 minutes, 03-04 times a day for the first 2 to 3 days. After that, you can switch between ice and heat packs.  Ask your doctor about back exercises or massage.  Avoid feeling anxious or stressed. Find good ways to deal with stress, such as exercise. GET HELP RIGHT AWAY IF:   Your pain does not go away with rest or medicine.  Your pain does not go away in 1 week.  You have new problems.  You do not feel well.  The pain spreads into your legs.  You cannot control when you poop (bowel movement) or pee (urinate).  Your arms or legs feel weak or lose feeling (numbness).  You feel sick to your stomach (nauseous) or throw up (vomit).  You have belly (abdominal) pain.  You feel like you may pass out (faint). MAKE SURE YOU:   Understand these instructions.  Will watch your condition.  Will get help right away if you are not doing well or get worse. Document Released: 03/22/2008 Document Revised: 12/27/2011 Document Reviewed: 02/05/2014 [facility] Patient Information 2015 [place], [place]. This information is not intended  to replace advice given to you by your health care provider. Make sure you discuss any questions you have with your health care provider. Urinary Tract Infection Urinary tract infections (UTIs) can develop anywhere along your urinary tract. Your urinary tract is your body's drainage system for removing wastes and extra water. Your urinary tract includes two kidneys, two ureters, a bladder, and a urethra. Your kidneys are a pair of bean-shaped organs. Each kidney is about the size of your fist. They are located below your ribs, one on each side of your spine. CAUSES Infections are caused by microbes, which are microscopic organisms, including fungi, viruses, and bacteria. These organisms are so small that they can only be seen through a microscope. Bacteria are the microbes that most commonly cause UTIs. SYMPTOMS  Symptoms of UTIs may vary by age and gender of the patient and by the location of the infection. Symptoms in young women typically include a frequent and intense urge to urinate and a painful, burning feeling in the bladder or urethra during urination. Older women and men are more likely to be tired, shaky, and weak and have muscle aches and abdominal pain. A fever may mean the infection is in your kidneys. Other symptoms of a kidney infection include pain in your back or sides below the ribs, nausea,  and vomiting. DIAGNOSIS To diagnose a UTI, your caregiver will ask you about your symptoms. Your caregiver also will ask to provide a urine sample. The urine sample will be tested for bacteria and white blood cells. White blood cells are made by your body to help fight infection. TREATMENT  Typically, UTIs can be treated with medication. Because most UTIs are caused by a bacterial infection, they usually can be treated with the use of antibiotics. The choice of antibiotic and length of treatment depend on your symptoms and the type of bacteria causing your infection. HOME CARE INSTRUCTIONS  If you  were prescribed antibiotics, take them exactly as your caregiver instructs you. Finish the medication even if you feel better after you have only taken some of the medication.  Drink enough water and fluids to keep your urine clear or pale yellow.  Avoid caffeine, tea, and carbonated beverages. They tend to irritate your bladder.  Empty your bladder often. Avoid holding urine for long periods of time.  Empty your bladder before and after sexual intercourse.  After a bowel movement, women should cleanse from front to back. Use each tissue only once. SEEK MEDICAL CARE IF:   You have back pain.  You develop a fever.  Your symptoms do not begin to resolve within 3 days. SEEK IMMEDIATE MEDICAL CARE IF:   You have severe back pain or lower abdominal pain.  You develop chills.  You have nausea or vomiting.  You have continued burning or discomfort with urination. MAKE SURE YOU:   Understand these instructions.  Will watch your condition.  Will get help right away if you are not doing well or get worse. Document Released: 07/14/2005 Document Revised: 04/04/2012 Document Reviewed: 11/12/2011 [facility] Patient Information 2015 [place], [place]. This information is not intended to replace advice given to you by your health care provider. Make sure you discuss any questions you have with your health care provider.

## 2015-05-27 NOTE — Telephone Encounter (Signed)
Left detailed message on cell # with results and to call back PRN, ok per DPR.//kn

## 2015-05-28 ENCOUNTER — Telehealth: Payer: Self-pay | Admitting: Obstetrics & Gynecology

## 2015-05-28 ENCOUNTER — Telehealth: Payer: Self-pay

## 2015-05-28 LAB — URINALYSIS, MICROSCOPIC ONLY
Bacteria, UA: NONE SEEN [HPF]
Casts: NONE SEEN [LPF]
Crystals: NONE SEEN [HPF]
RBC / HPF: NONE SEEN RBC/HPF (ref <=2)
SQUAMOUS EPITHELIAL / LPF: NONE SEEN [HPF] (ref <=5)
WBC, UA: NONE SEEN WBC/HPF (ref <=5)
Yeast: NONE SEEN [HPF]

## 2015-05-28 LAB — WET PREP BY MOLECULAR PROBE
Candida species: NEGATIVE
Gardnerella vaginalis: NEGATIVE
Trichomonas vaginosis: NEGATIVE

## 2015-05-28 LAB — URINE CULTURE
COLONY COUNT: NO GROWTH
Organism ID, Bacteria: NO GROWTH

## 2015-05-28 NOTE — Telephone Encounter (Signed)
-----   Message from Verner Chol, CNM sent at 05/28/2015  7:48 AM EDT ----- Notify patient that urine micro is negative and culture is pending Affirm negative for yeast, trichomonas and bv GC,Chlamydia pending Patient status?

## 2015-05-28 NOTE — Telephone Encounter (Signed)
Patient returning call.

## 2015-05-28 NOTE — Telephone Encounter (Signed)
Opened in error

## 2015-05-28 NOTE — Telephone Encounter (Signed)
lmtcb

## 2015-05-28 NOTE — Progress Notes (Signed)
Reviewed personally.  M. Suzanne Robertt Buda, MD.  

## 2015-05-28 NOTE — Telephone Encounter (Signed)
Left message to call back  

## 2015-05-28 NOTE — Telephone Encounter (Signed)
Patient notified of results as written by provider 

## 2015-05-30 LAB — IPS N GONORRHOEA AND CHLAMYDIA BY PCR

## 2015-07-01 ENCOUNTER — Telehealth: Payer: Self-pay | Admitting: Certified Nurse Midwife

## 2015-07-01 ENCOUNTER — Telehealth: Payer: Self-pay

## 2015-07-01 NOTE — Telephone Encounter (Signed)
Patient is having discharge with odor. Patient is requesting an appointment Friday 07/04/15. Patient is only available after 12:00pm.

## 2015-07-01 NOTE — Telephone Encounter (Signed)
lmtcb-see if she has had any pelvic PT.//kn

## 2015-07-01 NOTE — Telephone Encounter (Signed)
    Expand All Collapse All   Patient is having discharge with odor. Patient is requesting an appointment Friday 07/04/15. Patient is only available after 12:00pm on Friday.

## 2015-07-01 NOTE — Telephone Encounter (Signed)
Spoke with patient. Patient states that she began to have a vaginal odor with "chalk" colored discharge last week. Used Monistat and states the odor worsened. Denies any abdominal or pelvic pain. Advised will need to be seen in office for further evaluation. Patient is agreeable and verbalizes understanding. Requesting appointment for Friday after 12 pm. Patient is agreeable to see another provider as Leota Sauers CNM does not currently have an appointment available after 12 pm. Appointment scheduled for 07/04/2015 at 1 pm with Dr.Silva. Agreeable to date and time.  Routing to provider for final review. Patient agreeable to disposition. Will close encounter.

## 2015-07-04 ENCOUNTER — Encounter: Payer: Self-pay | Admitting: Obstetrics and Gynecology

## 2015-07-04 ENCOUNTER — Ambulatory Visit (INDEPENDENT_AMBULATORY_CARE_PROVIDER_SITE_OTHER): Payer: 59 | Admitting: Obstetrics and Gynecology

## 2015-07-04 VITALS — BP 126/70 | HR 60 | Resp 16 | Ht 66.25 in | Wt 159.0 lb

## 2015-07-04 DIAGNOSIS — Z113 Encounter for screening for infections with a predominantly sexual mode of transmission: Secondary | ICD-10-CM | POA: Diagnosis not present

## 2015-07-04 DIAGNOSIS — N760 Acute vaginitis: Secondary | ICD-10-CM | POA: Diagnosis not present

## 2015-07-04 LAB — HEPATITIS C ANTIBODY: HCV Ab: NEGATIVE

## 2015-07-04 LAB — WET PREP BY MOLECULAR PROBE
CANDIDA SPECIES: NEGATIVE
Gardnerella vaginalis: NEGATIVE
TRICHOMONAS VAG: NEGATIVE

## 2015-07-04 NOTE — Progress Notes (Addendum)
GYNECOLOGY  VISIT   HPI: 49 y.o.   Divorced  [place]  female   [phone] with Patient's last menstrual period was 10/18/1993 (approximate).   here for Vaginitis  Went to [facility] Had a condom break with intercourse with new partner there. Having foul odor.  Took 2 doxycycline of her sister and odor seems better now.  Feels that her bladder symptoms are improved.  Does not feel like she is needing physical therapy.  FSH 84.5 in November 2013.   Works at [employer] office.   GYNECOLOGIC HISTORY: Patient's last menstrual period was 10/18/1993 (approximate). Contraception: Tubal Ligation Menopausal hormone therapy: None  Last mammogram:  07/22/14 BIRADS1:neg Last pap smear: 12/04/14 Neg        OB History    Gravida Para Term Preterm AB TAB SAB Ectopic Multiple Living   Patient Active Problem List   Diagnosis Date Noted  . Back pain, acute 04/13/2015  . Dysuria 04/13/2015  . UTI (urinary tract infection) 11/06/2013  . Migraines     Past Medical History  Diagnosis Date  . Migraines mid teens  . Abnormal Pap smear of cervix 2001    treated with colpo biopsy and cryotherapy - normal since  . Chronic constipation   . Thyroid disease 2004    hypothyroid    Past Surgical History  Procedure Laterality Date  . Tubal ligation  1995  . Gynecologic cryosurgery  2001  . Dilation and curettage of uterus  1994    TAB    Current Outpatient Prescriptions  Medication Sig Dispense Refill  . levothyroxine (SYNTHROID, LEVOTHROID) 112 MCG tablet Take 112 mcg by mouth daily before breakfast.    . NONFORMULARY OR COMPOUNDED ITEM Boric acid suppository , size 0 gelatin capsule,1 pv twice weekly.  With flare use daily pv for 3 days. (Patient not taking: Reported on 05/27/2015) 60 each 12   No current facility-administered medications for this visit.     ALLERGIES: Review of patient's allergies indicates no known allergies.  Family History   Problem Relation Age of Onset  . Diabetes Mother   . Thyroid disease Mother   . Dementia Mother   . Stroke Mother   . Hypertension Mother   . Aneurysm Father   . Heart attack Father   . Hypertension Sister   . Hypertension Brother   . Heart attack Brother 49  . Diabetes Brother   . Esophageal cancer Neg Hx   . Rectal cancer Neg Hx   . Stomach cancer Neg Hx   . Hypertension Brother   . Colon cancer Sister 68  . Hypertension Brother   . Stroke Maternal Grandmother   . Diabetes Paternal Grandmother     Social History   Social History  . Marital Status: Divorced    Spouse Name: N/A  . Number of Children: N/A  . Years of Education: N/A   Occupational History  . Not on file.   Social History Main Topics  . Smoking status: Never Smoker   . Smokeless tobacco: Never Used  . Alcohol Use: 1.8 oz/week    3 Standard drinks or equivalent per week  . Drug Use: No  . Sexual Activity: No     Comment: BTL   Other Topics Concern  . Not on file   Social History Narrative    ROS:  Pertinent items are noted in HPI.  PHYSICAL  EXAMINATION:    BP 126/70 mmHg  Pulse 60  Resp 16  Ht 5' 6.25" (1.683 m)  Wt 159 lb (72.122 kg)  BMI 25.46 kg/m2  LMP 10/18/1993 (Approximate)    General appearance: alert, cooperative and appears stated age   Pelvic: External genitalia:  no lesions              Urethra:  normal appearing urethra with no masses, tenderness or lesions              Bartholins and Skenes: normal                 Vagina: normal appearing vagina with normal color and discharge, no lesions              Cervix: no lesions               Bimanual Exam:  Uterus:  normal size, contour, position, consistency, mobility, non-tender              Adnexa: normal adnexa and no mass, fullness, tenderness         Chaperone was present for exam.  ASSESSMENT  Vaginitis.  Need for screening for STDs. Status post BTL.   PLAN  Affirm, STD testing.  Await results for treatment  if needed. She will get her mammogram up to date. Return for annual exam.    An After Visit Summary was printed and given to the patient.  __15__ minutes face to face time of which over 50% was spent in counseling.

## 2015-07-05 LAB — STD PANEL
HIV 1&2 Ab, 4th Generation: NONREACTIVE
Hepatitis B Surface Ag: NEGATIVE

## 2015-07-07 NOTE — Telephone Encounter (Signed)
Patient will call if she needs any PT information.//kn

## 2015-07-08 LAB — IPS N GONORRHOEA AND CHLAMYDIA BY PCR

## 2015-07-10 ENCOUNTER — Telehealth: Payer: Self-pay

## 2015-07-10 NOTE — Telephone Encounter (Signed)
Called patient at [phone] to discuss lab results, left message on voicemail to call me back.

## 2015-07-10 NOTE — Telephone Encounter (Signed)
Left message to call Kaitlyn at [phone].  Need to verify patient's pharmacy.

## 2015-07-10 NOTE — Telephone Encounter (Signed)
Ok for Flagyl 50 mg po bid for 7 days.  #14, RF zero.  ETOH precautions.   Diflucan 150 mg po x 1.  #2, RF zero.   Please send to pharmacy of choice.

## 2015-07-10 NOTE — Telephone Encounter (Signed)
-----   Message from Patton Salles, MD sent at 07/06/2015 11:35 AM EDT ----- Results to patient through My Chart. GC/T are still pending.

## 2015-07-10 NOTE — Telephone Encounter (Signed)
Patient returned my call.  Notified patient GC/CT negative, Affirm and STD panel also negative.  Patient states glad STD work up all negative but still having vaginal odor.  She feels test for bacterial test was negative because she took some Doxycycline her sister had.  Patient requesting Flagyl. Advised will discuss with Dr. Edward Jolly and call her back.  She then states if she takes Flagyl, she will then need Diflucan for possible yeast.  Routed to Dr. Edward Jolly.

## 2015-07-10 NOTE — Telephone Encounter (Signed)
-----   Message from Patton Salles, MD sent at 07/08/2015  4:17 PM EDT ----- Results to patient through My Chart. GC/CT negative.

## 2015-07-14 MED ORDER — FLUCONAZOLE 150 MG PO TABS: 150.0000 mg | ORAL_TABLET | Freq: Once | ORAL | Status: DC

## 2015-07-14 MED ORDER — METRONIDAZOLE 500 MG PO TABS: 500.0000 mg | ORAL_TABLET | Freq: Two times a day (BID) | ORAL | Status: DC

## 2015-07-14 NOTE — Telephone Encounter (Signed)
Thank you for correcting the dosage to Flagyl 500 mg po bid for 7 days.  You are correct.  Encounter closed.

## 2015-07-14 NOTE — Telephone Encounter (Signed)
Spoke with patient. Advised of message as seen below from Dr.Silva. Patient is agreeable. Order for Flagyl 500 mg bid for 7 days #14 0RF and Diflucan 150 mg po x1 #2 0RF sent to [facility] pharmacy on file. ETOH precautions given.   Dr.Silva, please review order I have placed for Flagyl 500 mg bid for 7 days #14 as note below states 50 mg.

## 2015-07-25 ENCOUNTER — Ambulatory Visit (INDEPENDENT_AMBULATORY_CARE_PROVIDER_SITE_OTHER): Payer: 59 | Admitting: Certified Nurse Midwife

## 2015-07-25 ENCOUNTER — Encounter: Payer: Self-pay | Admitting: Certified Nurse Midwife

## 2015-07-25 VITALS — BP 110/64 | HR 68 | Resp 16 | Ht 66.75 in | Wt 161.0 lb

## 2015-07-25 DIAGNOSIS — L298 Other pruritus: Secondary | ICD-10-CM

## 2015-07-25 DIAGNOSIS — R5383 Other fatigue: Secondary | ICD-10-CM

## 2015-07-25 DIAGNOSIS — R233 Spontaneous ecchymoses: Secondary | ICD-10-CM

## 2015-07-25 DIAGNOSIS — R238 Other skin changes: Secondary | ICD-10-CM | POA: Diagnosis not present

## 2015-07-25 DIAGNOSIS — Z833 Family history of diabetes mellitus: Secondary | ICD-10-CM | POA: Diagnosis not present

## 2015-07-25 DIAGNOSIS — N898 Other specified noninflammatory disorders of vagina: Secondary | ICD-10-CM

## 2015-07-25 LAB — CBC
HEMATOCRIT: 38.7 % (ref 36.0–46.0)
HEMOGLOBIN: 12.4 g/dL (ref 12.0–15.0)
MCH: 27.6 pg (ref 26.0–34.0)
MCHC: 32 g/dL (ref 30.0–36.0)
MCV: 86 fL (ref 78.0–100.0)
MPV: 10.2 fL (ref 8.6–12.4)
Platelets: 275 10*3/uL (ref 150–400)
RBC: 4.5 MIL/uL (ref 3.87–5.11)
RDW: 13.9 % (ref 11.5–15.5)
WBC: 6.4 10*3/uL (ref 4.0–10.5)

## 2015-07-25 LAB — HEMOGLOBIN A1C
Hgb A1c MFr Bld: 6.1 % — ABNORMAL HIGH (ref <5.7)
Mean Plasma Glucose: 128 mg/dL — ABNORMAL HIGH (ref <117)

## 2015-07-25 NOTE — Patient Instructions (Signed)
Dr. Genella Mech Dr. Kriste Basque    Will call with lab results

## 2015-07-25 NOTE — Progress Notes (Signed)
  49 y.o.Divorced african [place] female [id number] here with complaint of vaginal symptoms of itching, burning, and increase discharge. Describes discharge as scant and having occasional itching. No vaginal odor. No new personal products or soaps. Onset of symptoms about one month. Denies new personal products or vaginal dryness.No STD concerns screening negative in 9/16. Urinary symptoms none . Contraception is. Condoms. Patient also concerned about her risk of diabetes with strong family history of with brothers(3),Mother and PGM. Would like to have screening lab if possible. PCP did not do any lab work. She also has had frequent bruising, which is not new with some fatigue. Sleeps well no issues. Balanced diet, no illness. Would like to change PCP to a female and request information regarding. No other concerns.   O:Healthy female WDWN Affect: normal, orientation x 3  Exam:Skin: normal appearance, slight bruise noted on right leg only, resolving Abdomen: soft, non tender,  Lymph node: no enlargement or tenderness Pelvic exam: External genital: normal female, no lesions or scaling or exudate BUS: negative Vagina: normal appearing discharge noted.  Affirm taken. No lesions, non tender Cervix: normal, non tender, no CMT Uterus: normal, non tender Adnexa:normal, non tender, no masses or fullness noted   A:Normal pelvic exam R/o Vaginal infection Easy bruising Fatigue Strong family history of Diabetes (3 brothers, mother, PGM) desires screening Long history of Depo Provera use   P:Discussed findings of normal appearing vaginal discharge, but will await affirm results and treat if indicated. Discussed change in ph in vagina in perimenopausal and etiology. Discussed Aveeno or baking soda sitz bath for comfort. Avoid moist clothes  for extended period of time. If working out in gym clothes or swim suits for long periods of time change underwear or bottoms of swimsuit if possible.Coconut Oil use  for skin protection prior to activity can be used to external skin. Discussed importance of good diet to prevent bruising and fatigue. Patient will work on.  Labs: CBC, Hgb A1-c, Vitamin D Affirm Given Dr. Genella Mech or Dr. Kriste Basque as PCP options and can use [facility] web site.  Rv prn

## 2015-07-26 LAB — WET PREP BY MOLECULAR PROBE
CANDIDA SPECIES: NEGATIVE
Gardnerella vaginalis: POSITIVE — AB
TRICHOMONAS VAG: NEGATIVE

## 2015-07-26 LAB — VITAMIN D 25 HYDROXY (VIT D DEFICIENCY, FRACTURES): Vit D, 25-Hydroxy: 20 ng/mL — ABNORMAL LOW (ref 30–100)

## 2015-07-28 ENCOUNTER — Other Ambulatory Visit: Payer: Self-pay | Admitting: Certified Nurse Midwife

## 2015-07-28 DIAGNOSIS — B9689 Other specified bacterial agents as the cause of diseases classified elsewhere: Secondary | ICD-10-CM

## 2015-07-28 DIAGNOSIS — N760 Acute vaginitis: Principal | ICD-10-CM

## 2015-07-28 MED ORDER — HYLAFEM VA SUPP: 1.0000 | Freq: Every day | VAGINAL | Status: DC

## 2015-07-29 NOTE — Progress Notes (Signed)
Encounter reviewed Jonel Sick, MD   

## 2015-07-30 ENCOUNTER — Telehealth: Payer: Self-pay | Admitting: Obstetrics & Gynecology

## 2015-07-30 MED ORDER — METRONIDAZOLE 500 MG PO TABS: 500.0000 mg | ORAL_TABLET | Freq: Two times a day (BID) | ORAL | Status: DC

## 2015-07-30 NOTE — Telephone Encounter (Signed)
Patient was prescribed Hylafem by Dr. Hyacinth MeekerMiller 07/28/15 and she said that it was too expensive, patient would like to get treatment in a pill form if that's possible. [facility] [employer] # [phone]

## 2015-07-30 NOTE — Telephone Encounter (Addendum)
Debbi,  can you review and advise if can have Flagyl 500 mg po bid x 7 days for treatment of bacterial vaginosis on wet prep  07/25/15.

## 2015-07-30 NOTE — Telephone Encounter (Signed)
Flagyl is fine

## 2015-07-30 NOTE — Telephone Encounter (Signed)
Spoke with patient and advised order sent for Flagyl 500 mg po bid x 7 days. Patient states she has taken this before and aware of alcohol precautions and possible GI upset.  Patient requests pre-treatment with Diflucan, she states that Flagyl usually will cause yeast. Advised that if develops yeast can call back for office visit or can try  Monistat 3 or 7 day treatment (OTC) and Hydrocortisone ointment for external irritation. Advised to keep area clean and dry. Change Damp clothes as soon as possible. White cotton underwear is best. Can also try Aveeno Oatmeal Sitz Baths for relief of external irritation. Patient agreeable to instructions and will follow up as needed.

## 2015-07-30 NOTE — Telephone Encounter (Signed)
Deb, can you review this pt.  She is a patient you saw the other day.

## 2015-07-31 ENCOUNTER — Telehealth: Payer: Self-pay | Admitting: Certified Nurse Midwife

## 2015-07-31 NOTE — Telephone Encounter (Signed)
I am fine with her recheck especially if still elevated she will have follow up with PCP. But fasting not needed for Hgb A1-c. Will close encounter

## 2015-07-31 NOTE — Telephone Encounter (Signed)
Routing message to PepsiCoDeborah Leonard CNM. Okay to close?

## 2015-07-31 NOTE — Telephone Encounter (Signed)
Patient calling in regards to her HgbA1C being elevated she wanted Ms. Debbie to know that that reading is not a true reading since she did not fast that day. Patient said she had a smoothie, candy, and chips throughout that day and that's why it is elevated. Patient says that she will get a true level tested at her family doctor. She is trying to get life insurance and does not want this result to effect that. If patient needs to be reached her best # is [phone].

## 2015-10-03 ENCOUNTER — Ambulatory Visit: Payer: 59 | Admitting: Internal Medicine

## 2016-11-25 ENCOUNTER — Encounter: Payer: Self-pay | Admitting: Certified Nurse Midwife

## 2016-11-25 ENCOUNTER — Ambulatory Visit: Payer: 59 | Admitting: Certified Nurse Midwife

## 2016-11-25 NOTE — Progress Notes (Deleted)
51 y.o. [id number] Divorced  {Race/ethnicity:17218} Fe here for annual exam.    Patient's last menstrual period was 10/18/1993 (approximate).          Sexually active: {yes no:314532}  The current method of family planning is {contraception:315051}.    Exercising: {yes no:314532}  {types:19826} Smoker:  {YES NO:22349}  Health Maintenance: Pap:  12-04-14 neg MMG:  07-19-14 category c density birads 1:neg Colonoscopy:  2015 BMD:   2013 TDaP:  *** Shingles: *** Pneumonia: *** Hep C and HIV: both neg 2016 Labs: *** Self breast exam:   reports that she has never smoked. She has never used smokeless tobacco. She reports that she drinks about 1.8 oz of alcohol per week . She reports that she does not use drugs.  Past Medical History:  Diagnosis Date  . Abnormal Pap smear of cervix 2001   treated with colpo biopsy and cryotherapy - normal since  . Chronic constipation   . Migraines mid teens  . Thyroid disease 2004   hypothyroid    Past Surgical History:  Procedure Laterality Date  . DILATION AND CURETTAGE OF UTERUS  1994   TAB  . GYNECOLOGIC CRYOSURGERY  2001  . TUBAL LIGATION  1995    Current Outpatient Prescriptions  Medication Sig Dispense Refill  . Homeopathic Products (HYLAFEM) SUPP Place 1 suppository vaginally at bedtime. 3 suppository 1  . levothyroxine (SYNTHROID, LEVOTHROID) 112 MCG tablet Take 112 mcg by mouth daily before breakfast.    . metroNIDAZOLE (FLAGYL) 500 MG tablet Take 1 tablet (500 mg total) by mouth 2 (two) times daily. 14 tablet 0   No current facility-administered medications for this visit.     Family History  Problem Relation Age of Onset  . Diabetes Mother   . Thyroid disease Mother   . Dementia Mother   . Stroke Mother   . Hypertension Mother   . Aneurysm Father   . Heart attack Father   . Hypertension Sister   . Fibromyalgia Sister   . Hypertension Brother   . Heart attack Brother 49  . Diabetes Brother   . Esophageal cancer Neg Hx    . Rectal cancer Neg Hx   . Stomach cancer Neg Hx   . Hypertension Brother   . Diabetes Brother   . Colon cancer Sister 6  . Hypertension Brother   . Stroke Maternal Grandmother   . Diabetes Paternal Grandmother     ROS:  Pertinent items are noted in HPI.  Otherwise, a comprehensive ROS was negative.  Exam:   LMP 10/18/1993 (Approximate)    Ht Readings from Last 3 Encounters:  07/25/15 5' 6.75" (1.695 m)  07/04/15 5' 6.25" (1.683 m)  05/27/15 5' 6.25" (1.683 m)    General appearance: alert, cooperative and appears stated age Head: Normocephalic, without obvious abnormality, atraumatic Neck: no adenopathy, supple, symmetrical, trachea midline and thyroid {EXAM; THYROID:18604} Lungs: clear to auscultation bilaterally Breasts: {Exam; breast:13139::"normal appearance, no masses or tenderness"} Heart: regular rate and rhythm Abdomen: soft, non-tender; no masses,  no organomegaly Extremities: extremities normal, atraumatic, no cyanosis or edema Skin: Skin color, texture, turgor normal. No rashes or lesions Lymph nodes: Cervical, supraclavicular, and axillary nodes normal. No abnormal inguinal nodes palpated Neurologic: Grossly normal   Pelvic: External genitalia:  no lesions              Urethra:  normal appearing urethra with no masses, tenderness or lesions              Bartholin's  and Skene's: normal                 Vagina: normal appearing vagina with normal color and discharge, no lesions              Cervix: {exam; cervix:14595}              Pap taken: {yes no:314532} Bimanual Exam:  Uterus:  {exam; uterus:12215}              Adnexa: {exam; adnexa:12223}               Rectovaginal: Confirms               Anus:  normal sphincter tone, no lesions  Chaperone present: ***  A:  Well Woman with normal exam  P:   Reviewed health and wellness pertinent to exam  Pap smear as above  {plan; gyn:5269::"mammogram","pap smear","return annually or prn"}  An After Visit Summary  was printed and given to the patient.

## 2017-01-06 ENCOUNTER — Telehealth: Payer: Self-pay | Admitting: Obstetrics and Gynecology

## 2017-01-06 ENCOUNTER — Encounter: Payer: Self-pay | Admitting: Obstetrics and Gynecology

## 2017-01-06 ENCOUNTER — Ambulatory Visit (INDEPENDENT_AMBULATORY_CARE_PROVIDER_SITE_OTHER): Payer: Managed Care, Other (non HMO) | Admitting: Obstetrics and Gynecology

## 2017-01-06 VITALS — BP 144/88 | HR 76 | Resp 14 | Ht 66.75 in | Wt 164.0 lb

## 2017-01-06 DIAGNOSIS — Z01419 Encounter for gynecological examination (general) (routine) without abnormal findings: Secondary | ICD-10-CM

## 2017-01-06 DIAGNOSIS — N950 Postmenopausal bleeding: Secondary | ICD-10-CM

## 2017-01-06 DIAGNOSIS — Z833 Family history of diabetes mellitus: Secondary | ICD-10-CM

## 2017-01-06 DIAGNOSIS — E039 Hypothyroidism, unspecified: Secondary | ICD-10-CM

## 2017-01-06 DIAGNOSIS — R1030 Lower abdominal pain, unspecified: Secondary | ICD-10-CM | POA: Diagnosis not present

## 2017-01-06 DIAGNOSIS — Z0000 Encounter for general adult medical examination without abnormal findings: Secondary | ICD-10-CM

## 2017-01-06 DIAGNOSIS — R350 Frequency of micturition: Secondary | ICD-10-CM

## 2017-01-06 DIAGNOSIS — K5900 Constipation, unspecified: Secondary | ICD-10-CM | POA: Diagnosis not present

## 2017-01-06 DIAGNOSIS — R102 Pelvic and perineal pain: Secondary | ICD-10-CM | POA: Diagnosis not present

## 2017-01-06 DIAGNOSIS — Z124 Encounter for screening for malignant neoplasm of cervix: Secondary | ICD-10-CM | POA: Diagnosis not present

## 2017-01-06 LAB — COMPREHENSIVE METABOLIC PANEL
ALT: 7 U/L (ref 6–29)
AST: 13 U/L (ref 10–35)
Albumin: 4.6 g/dL (ref 3.6–5.1)
Alkaline Phosphatase: 47 U/L (ref 33–130)
BUN: 11 mg/dL (ref 7–25)
CALCIUM: 9.3 mg/dL (ref 8.6–10.4)
CO2: 29 mmol/L (ref 20–31)
Chloride: 103 mmol/L (ref 98–110)
Creat: 0.85 mg/dL (ref 0.50–1.05)
GLUCOSE: 88 mg/dL (ref 65–99)
POTASSIUM: 4.2 mmol/L (ref 3.5–5.3)
Sodium: 139 mmol/L (ref 135–146)
Total Bilirubin: 0.4 mg/dL (ref 0.2–1.2)
Total Protein: 7.1 g/dL (ref 6.1–8.1)

## 2017-01-06 LAB — LIPID PANEL
CHOL/HDL RATIO: 3.2 ratio (ref <5.0)
CHOLESTEROL: 202 mg/dL — AB (ref <200)
HDL: 63 mg/dL (ref >50)
LDL CALC: 114 mg/dL — AB (ref <100)
TRIGLYCERIDES: 123 mg/dL (ref <150)
VLDL: 25 mg/dL (ref <30)

## 2017-01-06 LAB — CBC
HEMATOCRIT: 39.7 % (ref 35.0–45.0)
Hemoglobin: 12.6 g/dL (ref 11.7–15.5)
MCH: 27.5 pg (ref 27.0–33.0)
MCHC: 31.7 g/dL — AB (ref 32.0–36.0)
MCV: 86.5 fL (ref 80.0–100.0)
MPV: 10.3 fL (ref 7.5–12.5)
PLATELETS: 275 10*3/uL (ref 140–400)
RBC: 4.59 MIL/uL (ref 3.80–5.10)
RDW: 14.2 % (ref 11.0–15.0)
WBC: 4.9 10*3/uL (ref 3.8–10.8)

## 2017-01-06 LAB — TSH: TSH: 3.93 m[IU]/L

## 2017-01-06 LAB — HEMOGLOBIN A1C
Hgb A1c MFr Bld: 5.6 % (ref <5.7)
Mean Plasma Glucose: 114 mg/dL

## 2017-01-06 NOTE — Telephone Encounter (Signed)
Called patient to review benefits for a recommended procedure. Left Voicemail requesting a call back. °

## 2017-01-06 NOTE — Telephone Encounter (Signed)
Spoke with patient in regards to benefits for recommended tests.  Patient would like to speak with a nurse before she proceeds with scheduling.  Patient would like to get more information about the medical necessity of having ultrasound with possible sonohysterogram and possible endometrial biopsy.  I advised patient I will forward her question to a nurse for review.  Forwarding to [employer] ChemicalSally Yeakley

## 2017-01-06 NOTE — Progress Notes (Signed)
51 y.o. [id number] DivorcedAfrican AmericanF here for annual exam. Patient noticed some lite pink on the toilet paper after urinating, she has noticed it 3 x in the last 2 weeks. No blood on her underwear, no obvious hematuria. She thinks it's coming from her vagina. Last happened last week. She c/o a 4 week h/o intermittent BLQ abdominal/pelvic pain that radiates to her back. The pain can last for a week at a time, dull annoying pain, sometimes sharp, sometimes takes her breath away. Ranges from a 7-10/10 in severity. She breaks out in a sweat and gets nausea. She has some issues with diarrhea and constipation. Can go a week without BM, can take a laxative intermittently. She hasn't noticed a correlation of the pain with her bowels (bowels are a long term issue).  She c/o urinary frequency, no urgency, no dysuria. She c/o the urinary frequency for 2 weeks, she had negative testing for a UTI [facility].  Sexually active, same partner x 1 year. No dyspareunia.     Patient's last menstrual period was 10/18/1993 (approximate).          Sexually active: Yes.    The current method of family planning is tubal ligation.    Exercising: Yes.    walking Smoker:  no  Health Maintenance: Pap:  12-04-14 WNL  History of abnormal Pap:  Yes, h/o cryosurgery 2001 MMG:  07-19-14 WNL  Colonoscopy:  07-05-14 Normal  BMD:   09-07-12 WNL  TDaP:  2016 Gardasil: N/A   reports that she has never smoked. She has never used smokeless tobacco. She reports that she drinks about 1.2 oz of alcohol per week . She reports that she does not use drugs. 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  Past Medical History:  Diagnosis Date  . Abnormal Pap smear of cervix 2001   treated with colpo biopsy and cryotherapy - normal since  . Chronic constipation   . Migraine with aura mid teens  . Thyroid disease 2004   hypothyroid    Past Surgical History:  Procedure Laterality Date  . DILATION AND CURETTAGE OF  UTERUS  1994   TAB  . GYNECOLOGIC CRYOSURGERY  2001  . TUBAL LIGATION  1995    Current Outpatient Prescriptions  Medication Sig Dispense Refill  . levothyroxine (SYNTHROID, LEVOTHROID) 112 MCG tablet Take 112 mcg by mouth daily before breakfast.     No current facility-administered medications for this visit.     Family History  Problem Relation Age of Onset  . Diabetes Mother   . Thyroid disease Mother   . Dementia Mother   . Stroke Mother   . Hypertension Mother   . Aneurysm Father   . Heart attack Father   . Hypertension Sister   . Fibromyalgia Sister   . Hypertension Brother   . Heart attack Brother 49  . Diabetes Brother   . Heart disease Brother   . Hypertension Brother   . Diabetes Brother   . Colon cancer Sister [id number]  . Diabetes Brother   . Stroke Maternal Grandmother   . Diabetes Paternal Grandmother   . Esophageal cancer Neg Hx   . Rectal cancer Neg Hx   . Stomach cancer Neg Hx     Review of Systems  Constitutional: Negative.   HENT: Negative.   Eyes: Negative.   Respiratory: Negative.   Cardiovascular: Negative.   Gastrointestinal: Positive for abdominal pain and constipation.       Bloating  Endocrine:       Craving sweets  Genitourinary: Positive for frequency and urgency.       Postmenopause spotting   Musculoskeletal: Negative.   Skin: Negative.   Allergic/Immunologic: Negative.   Neurological: Negative.   Psychiatric/Behavioral: Negative.     Exam:   BP (!) 144/88 (BP Location: Right Arm, Patient Position: Sitting, Cuff Size: Normal)   Pulse 76   Resp 14   Ht 5' 6.75" (1.695 m)   Wt 164 lb (74.4 kg)   LMP 10/18/1993 (Approximate)   BMI 25.88 kg/m   Weight change: @WEIGHTCHANGE @ Height:   Height: 5' 6.75" (169.5 cm)  Ht Readings from Last 3 Encounters:  01/06/17 5' 6.75" (1.695 m)  07/25/15 5' 6.75" (1.695 m)  07/04/15 5' 6.25" (1.683 m)    General appearance: alert, cooperative and appears stated age Head: Normocephalic, without  obvious abnormality, atraumatic Neck: no adenopathy, supple, symmetrical, trachea midline and thyroid normal to inspection and palpation Lungs: clear to auscultation bilaterally Cardiovascular: regular rate and rhythm Breasts: normal appearance, no masses or tenderness Heart: regular rate and rhythm Abdomen: soft, non-tender; bowel sounds normal; no masses,  no organomegaly Extremities: extremities normal, atraumatic, no cyanosis or edema Skin: Skin color, texture, turgor normal. No rashes or lesions Lymph nodes: Cervical, supraclavicular, and axillary nodes normal. No abnormal inguinal nodes palpated Neurologic: Grossly normal   Pelvic: External genitalia:  no lesions              Urethra:  normal appearing urethra with no masses, tenderness or lesions              Bartholins and Skenes: normal                 Vagina: normal appearing vagina with normal color and discharge, no lesions              Cervix: no lesions               Bimanual Exam:  Uterus:  normal size, contour, position, consistency, mobility, non-tender and anteverted              Adnexa: no mass, fullness, tenderness               Rectovaginal: Fullness on RV, suspect stool               Anus:  normal sphincter tone, no lesions  Chaperone was present for exam.  A:  Well Woman with normal exam  Family history of diabetes  Postmenopausal bleeding  Urinary frequency  Abdominal/pelvic pain  Constipation  Hypothyroid  P:   Urine for ua, c&s  Pap with hpv  Return for a gyn ultrasound, possible sonohysterogram, possible endometrial biopsy  Referral to GI  Screening labs including TSH and HgbA1C  Mammogram overdue, #'s given  Discussed breast self exam  Discussed calcium and vit D intake

## 2017-01-06 NOTE — Patient Instructions (Signed)
EXERCISE AND DIET:  We recommended that you start or continue a regular exercise program for good health. Regular exercise means any activity that makes your heart beat faster and makes you sweat.  We recommend exercising at least 30 minutes per day at least 3 days a week, preferably 4 or 5.  We also recommend a diet low in fat and sugar.  Inactivity, poor dietary choices and obesity can cause diabetes, heart attack, stroke, and kidney damage, among others.    ALCOHOL AND SMOKING:  Women should limit their alcohol intake to no more than 7 drinks/beers/glasses of wine (combined, not each!) per week. Moderation of alcohol intake to this level decreases your risk of breast cancer and liver damage. And of course, no recreational drugs are part of a healthy lifestyle.  And absolutely no smoking or even second hand smoke. Most people know smoking can cause heart and lung diseases, but did you know it also contributes to weakening of your bones? Aging of your skin?  Yellowing of your teeth and nails?  CALCIUM AND VITAMIN D:  Adequate intake of calcium and Vitamin D are recommended.  The recommendations for exact amounts of these supplements seem to change often, but generally speaking 600 mg of calcium (either carbonate or citrate) and 800 units of Vitamin D per day seems prudent. Certain women may benefit from higher intake of Vitamin D.  If you are among these women, your doctor will have told you during your visit.    PAP SMEARS:  Pap smears, to check for cervical cancer or precancers,  have traditionally been done yearly, although recent scientific advances have shown that most women can have pap smears less often.  However, every woman still should have a physical exam from her gynecologist every year. It will include a breast check, inspection of the vulva and vagina to check for abnormal growths or skin changes, a visual exam of the cervix, and then an exam to evaluate the size and shape of the uterus and  ovaries.  And after 53 years of age, a rectal exam is indicated to check for rectal cancers. We will also provide age appropriate advice regarding health maintenance, like when you should have certain vaccines, screening for sexually transmitted diseases, bone density testing, colonoscopy, mammograms, etc.   MAMMOGRAMS:  All women over 40 years old should have a yearly mammogram. Many facilities now offer a "3D" mammogram, which may cost around $50 extra out of pocket. If possible,  we recommend you accept the option to have the 3D mammogram performed.  It both reduces the number of women who will be called back for extra views which then turn out to be normal, and it is better than the routine mammogram at detecting truly abnormal areas.    COLONOSCOPY:  Colonoscopy to screen for colon cancer is recommended for all women at age 50.  We know, you hate the idea of the prep.  We agree, BUT, having colon cancer and not knowing it is worse!!  Colon cancer so often starts as a polyp that can be seen and removed at colonscopy, which can quite literally save your life!  And if your first colonoscopy is normal and you have no family history of colon cancer, most women don't have to have it again for 10 years.  Once every ten years, you can do something that may end up saving your life, right?  We will be happy to help you get it scheduled when you are ready.    Be sure to check your insurance coverage so you understand how much it will cost.  It may be covered as a preventative service at no cost, but you should check your particular policy.      Breast Self-Awareness Breast self-awareness means being familiar with how your breasts look and feel. It involves checking your breasts regularly and reporting any changes to your health care provider. Practicing breast self-awareness is important. A change in your breasts can be a sign of a serious medical problem. Being familiar with how your breasts look and feel allows  you to find any problems early, when treatment is more likely to be successful. All women should practice breast self-awareness, including women who have had breast implants. How to do a breast self-exam One way to learn what is normal for your breasts and whether your breasts are changing is to do a breast self-exam. To do a breast self-exam: Look for Changes   1. Remove all the clothing above your waist. 2. Stand in front of a mirror in a room with good lighting. 3. Put your hands on your hips. 4. Push your hands firmly downward. 5. Compare your breasts in the mirror. Look for differences between them (asymmetry), such as:  Differences in shape.  Differences in size.  Puckers, dips, and bumps in one breast and not the other. 6. Look at each breast for changes in your skin, such as:  Redness.  Scaly areas. 7. Look for changes in your nipples, such as:  Discharge.  Bleeding.  Dimpling.  Redness.  A change in position. Feel for Changes   Carefully feel your breasts for lumps and changes. It is best to do this while lying on your back on the floor and again while sitting or standing in the shower or tub with soapy water on your skin. Feel each breast in the following way:  Place the arm on the side of the breast you are examining above your head.  Feel your breast with the other hand.  Start in the nipple area and make  inch (2 cm) overlapping circles to feel your breast. Use the pads of your three middle fingers to do this. Apply light pressure, then medium pressure, then firm pressure. The light pressure will allow you to feel the tissue closest to the skin. The medium pressure will allow you to feel the tissue that is a little deeper. The firm pressure will allow you to feel the tissue close to the ribs.  Continue the overlapping circles, moving downward over the breast until you feel your ribs below your breast.  Move one finger-width toward the center of the body.  Continue to use the  inch (2 cm) overlapping circles to feel your breast as you move slowly up toward your collarbone.  Continue the up and down exam using all three pressures until you reach your armpit. Write Down What You Find   Write down what is normal for each breast and any changes that you find. Keep a written record with breast changes or normal findings for each breast. By writing this information down, you do not need to depend only on memory for size, tenderness, or location. Write down where you are in your menstrual cycle, if you are still menstruating. If you are having trouble noticing differences in your breasts, do not get discouraged. With time you will become more familiar with the variations in your breasts and more comfortable with the exam. How often should I examine my   breasts? Examine your breasts every month. If you are breastfeeding, the best time to examine your breasts is after a feeding or after using a breast pump. If you menstruate, the best time to examine your breasts is 5-7 days after your period is over. During your period, your breasts are lumpier, and it may be more difficult to notice changes. When should I see my health care provider? See your health care provider if you notice:  A change in shape or size of your breasts or nipples.  A change in the skin of your breast or nipples, such as a reddened or scaly area.  Unusual discharge from your nipples.  A lump or thick area that was not there before.  Pain in your breasts.  Anything that concerns you. This information is not intended to replace advice given to you by your health care provider. Make sure you discuss any questions you have with your health care provider. Document Released: 10/04/2005 Document Revised: 03/11/2016 Document Reviewed: 08/24/2015 Elsevier Interactive Patient Education  2017 Elsevier Inc.  

## 2017-01-07 LAB — VITAMIN D 25 HYDROXY (VIT D DEFICIENCY, FRACTURES): Vit D, 25-Hydroxy: 18 ng/mL — ABNORMAL LOW (ref 30–100)

## 2017-01-07 LAB — URINALYSIS, MICROSCOPIC ONLY
Bacteria, UA: NONE SEEN [HPF]
CRYSTALS: NONE SEEN [HPF]
Casts: NONE SEEN [LPF]
RBC / HPF: NONE SEEN RBC/HPF (ref <=2)
Squamous Epithelial / LPF: NONE SEEN [HPF] (ref <=5)
WBC UA: NONE SEEN WBC/HPF (ref <=5)
Yeast: NONE SEEN [HPF]

## 2017-01-08 LAB — URINE CULTURE: Organism ID, Bacteria: NO GROWTH

## 2017-01-10 ENCOUNTER — Telehealth: Payer: Self-pay | Admitting: *Deleted

## 2017-01-10 NOTE — Telephone Encounter (Signed)
-----   Message from Romualdo BolkJill Evelyn Jertson, MD sent at 01/10/2017 12:35 PM EDT ----- Please inform the patient that her vit d level is low and have her start taking 2,000 IU of vit D3 daily. She should have a f/u vit d in 12 weeks. Please schedule this. Her HgbA1C has improved from last year.  Her LDL is minimally elevated as is her total cholesterol. The rest of her lipid panel was normal. She should eat a healthy diet, low in saturated fats. I would just recheck a fasting lipid panel next year.  Her pap is pending, the rest of her lab work is normal.

## 2017-01-10 NOTE — Telephone Encounter (Signed)
Left message to call regarding lab results -eh 

## 2017-01-11 LAB — IPS PAP TEST WITH HPV

## 2017-01-11 NOTE — Telephone Encounter (Signed)
Patient returning your call.

## 2017-01-11 NOTE — Telephone Encounter (Signed)
Returned patients call and left another message to call back -eh

## 2017-01-12 NOTE — Telephone Encounter (Signed)
Patient returning your call and asked to be called at work

## 2017-01-12 NOTE — Telephone Encounter (Signed)
Returned patients call- left another message to call back -eh 

## 2017-01-12 NOTE — Telephone Encounter (Signed)
Patient called again and states to please call her at work that she can't answer her cell phone.

## 2017-01-12 NOTE — Telephone Encounter (Signed)
Spoke with patient and gave results including PAP results which were normal. Patient scheduled for 12 week recheck vitamin D -eh

## 2017-01-18 NOTE — Telephone Encounter (Signed)
Follow-up call to patient regarding recommended appointment. Per ROI, can leave detailed message on voice mail. No specific clinical details left. Left message calling to review recommended procedure and answer any questions. Can call back and ask for [facility].

## 2017-01-24 ENCOUNTER — Ambulatory Visit (INDEPENDENT_AMBULATORY_CARE_PROVIDER_SITE_OTHER): Payer: Managed Care, Other (non HMO) | Admitting: Nurse Practitioner

## 2017-01-24 ENCOUNTER — Encounter: Payer: Self-pay | Admitting: Nurse Practitioner

## 2017-01-24 VITALS — BP 118/76 | HR 62 | Resp 12 | Ht 66.75 in | Wt 169.6 lb

## 2017-01-24 DIAGNOSIS — N760 Acute vaginitis: Secondary | ICD-10-CM | POA: Diagnosis not present

## 2017-01-24 NOTE — Patient Instructions (Signed)

## 2017-01-24 NOTE — Telephone Encounter (Signed)
Patient was seen  today and had a "culture" done. Patient is asking if this culture is going to a lab? I told patient it looks like her culture is goingt to [facility]. Patient does not want this culture to go to the lab. Patient is curious why this culture could not be resulted in office?

## 2017-01-24 NOTE — Progress Notes (Signed)
Patient ID: Shelly Castaneda, female   DOB: 1966/05/02, 51 y.o.   MRN: [id number]  51 y.o. Divorced [place] American female [phone] here with complaint of vaginal symptoms of increase discharge and odor.  Describes discharge as whitish. Onset of symptoms 1 days ago. Denies new personal products or vaginal dryness. Not SA X 2 wk's.  Same partner for over a year. No STD concerns. Urinary symptoms No . Contraception is Post menopause. Here for AEX 01/06/17.   O:  Healthy female WDWN Affect: normal, orientation x 3  Exam: no distress Abdomen: soft and non tender Lymph node: no enlargement or tenderness Pelvic exam: External genital: normal female BUS: negative Vagina: clear discharge noted.  Affirm taken.    A: R/O Vaginitis  History of BV 10/16   P: Discussed findings of vaginitis and etiology. Discussed Aveeno or baking soda sitz bath for comfort. Avoid moist clothes or pads for extended period of time. If working out in gym clothes or swim suits for long periods of time change underwear or bottoms of swimsuit if possible. Olive Oil/Coconut Oil use for skin protection prior to activity can be used to external skin.  Rx: prefers Flagyl if positive for BV  Follow with Affirm  RV prn

## 2017-01-24 NOTE — Telephone Encounter (Signed)
Spoke with patient and explained to her what an Affirm was and that we could not result that test here in the office. Patient verbalized understanding and agreement to have it sent to a lab.

## 2017-01-25 ENCOUNTER — Other Ambulatory Visit: Payer: Self-pay | Admitting: Nurse Practitioner

## 2017-01-25 ENCOUNTER — Telehealth: Payer: Self-pay | Admitting: Nurse Practitioner

## 2017-01-25 LAB — WET PREP BY MOLECULAR PROBE
CANDIDA SPECIES: NOT DETECTED
Gardnerella vaginalis: DETECTED — AB
TRICHOMONAS VAG: NOT DETECTED

## 2017-01-25 MED ORDER — METRONIDAZOLE 500 MG PO TABS: 500.0000 mg | ORAL_TABLET | Freq: Two times a day (BID) | ORAL | 0 refills | Status: DC

## 2017-01-25 NOTE — Telephone Encounter (Signed)
Patient checking on results. °

## 2017-01-25 NOTE — Telephone Encounter (Signed)
Spoke with patient, advised of results as seen below per Ria Comment, NP. Patient states she always gets a yeast infection when taking Flagyl and would like RX for diflucan sent to pharmacy on file. Advised patient would review with Ria Comment, NP and return call with recommendations. Patient verbalizes understanding and is agreeable.  Ria Comment, NP -ok to send diflucan?     Written by Ria Comment, FNP on 01/25/2017 7:41 AM  Shelly Castaneda, The vaginal wet prep was positive for bacterial vaginitis. There was no yeast. The medication Flagyl as requested will be sent to your pharmacy. Remember not to drink alcohol while on medication

## 2017-01-26 ENCOUNTER — Telehealth: Payer: Self-pay | Admitting: Nurse Practitioner

## 2017-01-26 MED ORDER — FLUCONAZOLE 150 MG PO TABS: 150.0000 mg | ORAL_TABLET | Freq: Once | ORAL | 0 refills | Status: AC

## 2017-01-26 NOTE — Telephone Encounter (Signed)
Call to patient to review benefit information and discuss scheduling recommended endometrial biopsy.  First attempt with voicemail on 01-17-17. Second attempt on 01-26-17. Mobile phone voice mailbox full unable to leave message. Call to work phone, listed as preferred, left voicemail to return call. Patient identified herself on voicemail by name. Left no financial details. Noted reason for call and return call information.

## 2017-01-26 NOTE — Telephone Encounter (Signed)
Spoke with patient, advised RX for diflucan to pharmacy on file. Advised take one tablet and repeat again in 72 hours if symptoms are still present. Patient verbalizes understanding and is agreeable.  Routing to provider for final review. Patient is agreeable to disposition. Will close encounter.

## 2017-01-26 NOTE — Telephone Encounter (Signed)
OK for Diflucan X 2 tabs.

## 2017-01-27 ENCOUNTER — Telehealth: Payer: Self-pay | Admitting: Obstetrics and Gynecology

## 2017-01-27 NOTE — Telephone Encounter (Signed)
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  Patient also states she had a sonohysterogram last week at [facility], she advises "everything was fine". Patient also states the bleeding has stopped. Patient will sign a medical release and have sonohysterogram results sent to our office.  Routing to Dr Oscar La  cc: Billie Ruddy

## 2017-01-27 NOTE — Telephone Encounter (Signed)
Please hold onto her chart until we get a copy of her sonohysterogram/evaluation.

## 2017-01-27 NOTE — Progress Notes (Signed)
Encounter reviewed by Dr. Brook Amundson C. Silva.  

## 2017-02-07 NOTE — Telephone Encounter (Signed)
I haven't seen it, can you call the office where it was done?

## 2017-02-07 NOTE — Telephone Encounter (Signed)
Have you received [facility] report? Not visible in Care Everywhere that I can find. There is not a ROI at front office that we requested records. Please advise.

## 2017-02-08 NOTE — Telephone Encounter (Signed)
Attempt to contact patient regarding [facility] records. Voice mailbox is full and unable to leave message.

## 2017-02-08 NOTE — Telephone Encounter (Signed)
Voice mailbox is full. Unable to leave message. i

## 2017-02-09 NOTE — Telephone Encounter (Signed)
Call to patient, voice mail box remains full and unable to leave message.  Will send My Chart message.

## 2017-02-22 NOTE — Telephone Encounter (Signed)
Please send the patient a letter explaining that postmenopausal bleeding can be a sign of endometrial cancer. It is recommended that every woman with postmenopausal bleeding be evaluated (any bleeding, even one spot). It is vital that she has evaluation. If she has had this elsewhere, then please assist us in getting those records, if not, I urge her to be evaluated.  Then close the encounter.

## 2017-02-22 NOTE — Telephone Encounter (Signed)
No response to phone calls or My Chart message (has not viewed it). Any further follow-up?

## 2017-03-16 ENCOUNTER — Encounter: Payer: Self-pay | Admitting: *Deleted

## 2017-03-16 NOTE — Telephone Encounter (Signed)
Still no response. Letter to your office for review.

## 2017-03-24 NOTE — Telephone Encounter (Signed)
Letter reviewed and signed by Dr Oscar LaJertson. Mailed certified and regular [place] mail.

## 2017-04-08 ENCOUNTER — Telehealth: Payer: Self-pay | Admitting: Obstetrics and Gynecology

## 2017-04-08 ENCOUNTER — Other Ambulatory Visit: Payer: Self-pay

## 2017-04-08 NOTE — Telephone Encounter (Signed)
Attempted to call patient regarding missed lab appointment. Number is disconnected.

## 2017-06-28 ENCOUNTER — Encounter: Payer: Self-pay | Admitting: Nurse Practitioner

## 2017-08-03 ENCOUNTER — Other Ambulatory Visit: Payer: Self-pay | Admitting: Family Medicine

## 2017-08-03 DIAGNOSIS — Z1231 Encounter for screening mammogram for malignant neoplasm of breast: Secondary | ICD-10-CM

## 2017-08-25 ENCOUNTER — Other Ambulatory Visit: Payer: Self-pay | Admitting: Family Medicine

## 2017-08-25 DIAGNOSIS — R5381 Other malaise: Secondary | ICD-10-CM

## 2017-09-16 ENCOUNTER — Ambulatory Visit: Payer: 59

## 2017-10-14 ENCOUNTER — Ambulatory Visit: Payer: Self-pay

## 2017-10-28 ENCOUNTER — Ambulatory Visit
Admission: RE | Admit: 2017-10-28 | Discharge: 2017-10-28 | Disposition: A | Discharge status: Home / Self Care | Payer: Managed Care, Other (non HMO) | Source: Ambulatory Visit | Attending: Family Medicine | Admitting: Family Medicine

## 2017-10-28 DIAGNOSIS — Z1231 Encounter for screening mammogram for malignant neoplasm of breast: Secondary | ICD-10-CM

## 2018-01-11 ENCOUNTER — Ambulatory Visit: Payer: Managed Care, Other (non HMO) | Admitting: Obstetrics and Gynecology

## 2019-08-09 ENCOUNTER — Other Ambulatory Visit: Payer: Self-pay | Admitting: Family Medicine

## 2019-08-09 DIAGNOSIS — Z1231 Encounter for screening mammogram for malignant neoplasm of breast: Secondary | ICD-10-CM

## 2019-09-24 ENCOUNTER — Ambulatory Visit (INDEPENDENT_AMBULATORY_CARE_PROVIDER_SITE_OTHER): Payer: 59 | Admitting: Cardiology

## 2019-09-24 ENCOUNTER — Other Ambulatory Visit: Payer: Self-pay

## 2019-09-24 ENCOUNTER — Encounter: Payer: Self-pay | Admitting: Cardiology

## 2019-09-24 VITALS — BP 161/88 | HR 62 | Temp 98.0°F | Ht 66.0 in | Wt 156.0 lb

## 2019-09-24 DIAGNOSIS — Z8249 Family history of ischemic heart disease and other diseases of the circulatory system: Secondary | ICD-10-CM

## 2019-09-24 DIAGNOSIS — Z136 Encounter for screening for cardiovascular disorders: Secondary | ICD-10-CM | POA: Diagnosis not present

## 2019-09-24 DIAGNOSIS — R030 Elevated blood-pressure reading, without diagnosis of hypertension: Secondary | ICD-10-CM | POA: Insufficient documentation

## 2019-09-24 DIAGNOSIS — I10 Essential (primary) hypertension: Secondary | ICD-10-CM | POA: Diagnosis not present

## 2019-09-24 MED ORDER — AMLODIPINE BESYLATE 5 MG PO TABS: 5.0000 mg | ORAL_TABLET | Freq: Every day | ORAL | 3 refills | Status: DC

## 2019-09-24 NOTE — Patient Instructions (Signed)
Diet & Lifestyle recommendations:  Physical activity recommendation (The Physical Activity Guidelines for Americans. JAMA 2018;Nov 12) At least 150-300 minutes a week of moderate-intensity, or 75-150 minutes a week of vigorous-intensity aerobic physical activity, or an equivalent combination of moderate- and vigorous-intensity aerobic activity. Adults should perform muscle-strengthening activities on 2 or more days a week. Older adults should do multicomponent physical activity that includes balance training as well as aerobic and muscle-strengthening activities. Benefits of increased physical activity include lower risk of mortality including cardiovascular mortality, lower risk of cardiovascular events and associated risk factors (hypertension and diabetes), and lower risk of many cancers (including bladder, breast, colon, endometrium, esophagus, kidney, lung, and stomach). Additional improvments have been seen in cognition, risk of dementia, anxiety and depression, improved bone health, lower risk of falls, and associated injuries.  Dietary recommendation The 2019 ACC/AHA guidelines promote nutrition as a main fixture of cardiovascular wellness, with a recommendation for a varied diet of fruit, vegetables, fish, legumes, and whole grains (Class I), as well as recommendations to reduce sodium, cholesterol, processed meats, and refined sugars (Class IIa recommendation).10 Sodium intake, a topic of some controversy as of late, is recommended to be kept at 1,500 mg/day or less, far below the average daily intake in the [place] of 3,409 mg/day, and notably below that of previous US recommendations for <2,300mg/day.10,11 For those unable to reach 1,500 mg/day, they recommend at least a reduction of 1000 mg/day.  A Pesco-Mediterranean Diet With Intermittent Fasting: JACC Review Topic of the Week. J Am Coll Cardiol 2020;76:1484-1493 Pesco-Mediterranean diet, it is supplemented with extra-virgin olive oil (EVOO),  which is the principle fat source, along with moderate amounts of dairy (particularly yogurt and cheese) and eggs, as well as modest amounts of alcohol consumption (ideally red wine with the evening meal), but few red and processed meats.  

## 2019-09-24 NOTE — Progress Notes (Signed)
Patient referred by Shelly Morning, DO for screening for cardiovascular condition  Subjective:   Shelly Castaneda, female    DOB: Jun 08, 1966, 53 y.o.   MRN: [id number]  Chief Complaint  Patient presents with  . screening for cardiovascular disorders  . New Patient (Initial Visit)    HPI  53 y.o. African female with hypertension, prediabetes, hypothyroidism, referred for screening for cardiovascular condition.  Patient has strong family h/o early CAD. 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 She denies any chest pain, has occasional shortness of breath. She reports episodes of palpitations, which are infrequent, occurring once every couple months. Blood pressure has been elevated for a while, but she is not on any antihypertensive therapy. She is required to undergo cardiac evaluation prior to life insurance approval.   Past Medical History:  Diagnosis Date  . Abnormal Pap smear of cervix 2001   treated with colpo biopsy and cryotherapy - normal since  . Chronic constipation   . Migraine with aura mid teens  . Thyroid disease 2004   hypothyroid     Past Surgical History:  Procedure Laterality Date  . DILATION AND CURETTAGE OF UTERUS  1994   TAB  . GYNECOLOGIC CRYOSURGERY  2001  . TUBAL LIGATION  1995     Social History   Tobacco Use  Smoking Status Never Smoker  Smokeless Tobacco Never Used    Social History   Substance and Sexual Activity  Alcohol Use Yes  . Alcohol/week: 2.0 standard drinks  . Types: 2 Standard drinks or equivalent per week     Family History  Problem Relation Age of Onset  . Diabetes Mother   . Thyroid disease Mother   . Dementia Mother   . Stroke Mother   . Hypertension Mother   . Aneurysm Father   . Heart attack Father   . Hypertension Sister   . Fibromyalgia Sister   . Hypertension Brother   . Heart attack Brother 1  . Diabetes Brother   . Heart disease Brother   . Hypertension Brother   . Diabetes Brother   . Colon  cancer Sister 46  . Diabetes Brother   . Stroke Maternal Grandmother   . Diabetes Paternal Grandmother   . Esophageal cancer Neg Hx   . Rectal cancer Neg Hx   . Stomach cancer Neg Hx      Current Outpatient Medications on File Prior to Visit  Medication Sig Dispense Refill  . levothyroxine (SYNTHROID, LEVOTHROID) 112 MCG tablet Take 112 mcg by mouth daily before breakfast.     No current facility-administered medications on file prior to visit.     Cardiovascular and other pertinent studies:   EKG 09/24/2019: Sinus rhythm 59 bpm. Normal EKG.    Recent labs: 05/11/2019: Glucose 89, BUN/Cr 16/0.93. EGFR 70. Na/K 143/4.5. Rest of the CMP normal H/H 12.4/38.1. MCV 86. Platelets 257 HbA1C 5.9% Chol 190, TG 55, HDL 54, LDL 125 TSH normal    Review of Systems  Constitution: Negative for decreased appetite, malaise/fatigue, weight gain and weight loss.  HENT: Negative for congestion.   Eyes: Negative for visual disturbance.  Cardiovascular: Positive for dyspnea on exertion (Mild) and palpitations. Negative for chest pain, leg swelling and syncope.  Respiratory: Negative for cough.   Endocrine: Negative for cold intolerance.  Hematologic/Lymphatic: Does not bruise/bleed easily.  Skin: Negative for itching and rash.  Musculoskeletal: Negative for myalgias.  Gastrointestinal: Negative for abdominal pain, nausea and vomiting.  Genitourinary: Negative for dysuria.  Neurological: Negative for dizziness and weakness.  Psychiatric/Behavioral: The patient is not nervous/anxious.   All other systems reviewed and are negative.        Vitals:   09/24/19 1317 09/24/19 1323  BP: (!) 166/93 (!) 161/88  Pulse: 71 62  Temp: 98 F (36.7 C)   SpO2: 96%      Body mass index is 25.18 kg/m. Filed Weights   09/24/19 1317  Weight: 156 lb (70.8 kg)     Objective:   Physical Exam  Constitutional: She is oriented to person, place, and time. She appears well-developed and  well-nourished. No distress.  HENT:  Head: Normocephalic and atraumatic.  Eyes: Pupils are equal, round, and reactive to light. Conjunctivae are normal.  Neck: No JVD present.  Cardiovascular: Normal rate, regular rhythm and intact distal pulses.  No murmur heard. Pulmonary/Chest: Effort normal and breath sounds normal. She has no wheezes. She has no rales.  Abdominal: Soft. Bowel sounds are normal. There is no rebound.  Musculoskeletal:        General: No edema.  Lymphadenopathy:    She has no cervical adenopathy.  Neurological: She is alert and oriented to person, place, and time. No cranial nerve deficit.  Skin: Skin is warm and dry.  Psychiatric: She has a normal mood and affect.  Nursing note and vitals reviewed.       Assessment & Recommendations:   53 y.o. African female with hypertension, prediabetes, hypothyroidism, referred for screening for cardiovascular condition.  Cardiac risk stratification,  No chest pain symptoms. <7.5% 10 yr ASCVD risk. Will obtain calcium score for risk stratification. Will obtain echocardiogram.  Hypertension: Amlodipine 5 mg daily.   F/u in 4 weeks.  Thank you for referring the patient to [place]. Please feel free to contact with any questions.  Nigel Mormon, MD [facility] Pager: [phone] Office: [phone]

## 2019-09-26 ENCOUNTER — Telehealth: Payer: Self-pay

## 2019-09-26 NOTE — Telephone Encounter (Signed)
Any particular reason why she wants losartan? 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

## 2019-09-26 NOTE — Telephone Encounter (Signed)
Telephone encounter:  Reason for call: Pt called and asked if you can prescribe Losartan instead of Amlodipine  Usual provider: MP  Last office visit: 10/24/19  Next office visit: 09/24/19   Last hospitalization: NA Current Outpatient Medications on File Prior to Visit  Medication Sig Dispense Refill  . amLODipine (NORVASC) 5 MG tablet Take 1 tablet (5 mg total) by mouth daily. 180 tablet 3  . levothyroxine (SYNTHROID, LEVOTHROID) 112 MCG tablet Take 112 mcg by mouth daily before breakfast.     No current facility-administered medications on file prior to visit.

## 2019-09-28 ENCOUNTER — Ambulatory Visit
Admission: RE | Admit: 2019-09-28 | Discharge: 2019-09-28 | Disposition: A | Discharge status: Home / Self Care | Payer: Managed Care, Other (non HMO) | Source: Ambulatory Visit | Attending: Family Medicine | Admitting: Family Medicine

## 2019-09-28 ENCOUNTER — Ambulatory Visit: Payer: Self-pay | Admitting: Cardiology

## 2019-09-28 ENCOUNTER — Other Ambulatory Visit: Payer: Self-pay

## 2019-09-28 DIAGNOSIS — Z1231 Encounter for screening mammogram for malignant neoplasm of breast: Secondary | ICD-10-CM

## 2019-10-02 ENCOUNTER — Ambulatory Visit (INDEPENDENT_AMBULATORY_CARE_PROVIDER_SITE_OTHER): Payer: 59

## 2019-10-02 ENCOUNTER — Other Ambulatory Visit: Payer: Self-pay

## 2019-10-02 DIAGNOSIS — I10 Essential (primary) hypertension: Secondary | ICD-10-CM | POA: Diagnosis not present

## 2019-10-24 ENCOUNTER — Telehealth: Payer: 59 | Admitting: Cardiology

## 2019-10-24 ENCOUNTER — Other Ambulatory Visit: Payer: Self-pay

## 2019-10-24 VITALS — BP 130/86

## 2019-10-24 DIAGNOSIS — Z8249 Family history of ischemic heart disease and other diseases of the circulatory system: Secondary | ICD-10-CM

## 2019-10-24 DIAGNOSIS — I10 Essential (primary) hypertension: Secondary | ICD-10-CM | POA: Insufficient documentation

## 2019-10-24 DIAGNOSIS — Z136 Encounter for screening for cardiovascular disorders: Secondary | ICD-10-CM

## 2019-10-24 NOTE — Progress Notes (Signed)
   Follow up visit  Subjective:   Shelly Castaneda, female    DOB: 12/29/1965, 53 y.o.   MRN: [id number]   connected with the patient on 10/24/2019 by a telephone call and verified that I am speaking with the correct person using two identifiers.     I offered the patient a video enabled application for a virtual visit. Unfortunately, this could not be accomplished due to technical difficulties/lack of video enabled phone/computer. I discussed the limitations of evaluation and management by telemedicine and the availability of in person appointments. The patient expressed understanding and agreed to proceed.   This visit type was conducted due to national recommendations for restrictions regarding the COVID-19 Pandemic (e.g. social distancing).  This format is felt to be most appropriate for this patient at this time.  All issues noted in this document were discussed and addressed.  No physical exam was performed (except for noted visual exam findings with Tele health visits).  The patient has consented to conduct a Tele health visit and understands insurance will be billed.    Chief complaint: Hypertension  HPI  53 y.o. African female with hypertension, prediabetes, hypothyroidism  Doing well. Blood pressure has improved.    Current Outpatient Medications on File Prior to Visit  Medication Sig Dispense Refill  . amLODipine (NORVASC) 5 MG tablet Take 1 tablet (5 mg total) by mouth daily. 180 tablet 3  . levothyroxine (SYNTHROID, LEVOTHROID) 112 MCG tablet Take 112 mcg by mouth daily before breakfast.     No current facility-administered medications on file prior to visit.    Cardiovascular & other pertient studies:  Echocardiogram 10/02/2019: Left ventricle cavity is normal in size and wall thickness. Normal LV systolic function with EF 67%. Normal global wall motion. Normal diastolic filling pattern.  No significant valvular abnormality.  IVC is dilated with respiratory  variation. This may suggest elevated right heart pressure  EKG 09/24/2019: Sinus rhythm 59 bpm. Normal EKG.    Recent labs: 05/11/2019: Glucose 89, BUN/Cr 16/0.93. EGFR 70. Na/K 143/4.5. Rest of the CMP normal H/H 12.4/38.1. MCV 86. Platelets 257 HbA1C 5.9% Chol 190, TG 55, HDL 54, LDL 125 TSH normal   Review of Systems  Cardiovascular: Negative for chest pain, dyspnea on exertion, leg swelling, palpitations and syncope.        Vitals:   10/24/19 1403  BP: 130/86      Objective:   Physical Exam  Not performed. Telephone visit.       Assessment & Recommendations:   53 y.o. African female with hypertension, prediabetes, hypothyroidism  Cardiac risk stratification,  No chest pain symptoms. Echocardiogram showed no significant abnormality. <7.5% 10 yr ASCVD risk. Patient would like to opt out of calcium score test at this time.   Hypertension: Better controlled. Continue amlodipine 5 mg daily.   F/u as needed.   Manish J Patwardhan, MD [facility] Pager: [phone] Office: [phone]    

## 2020-07-17 DIAGNOSIS — E039 Hypothyroidism, unspecified: Secondary | ICD-10-CM | POA: Insufficient documentation

## 2020-07-17 DIAGNOSIS — Z8614 Personal history of Methicillin resistant Staphylococcus aureus infection: Secondary | ICD-10-CM | POA: Insufficient documentation

## 2020-08-13 IMAGING — MG DIGITAL SCREENING BILAT W/ TOMO W/ CAD
8 series · 8 of 24 positions shown · non-contrast
Comparison: Previous exam(s).

CLINICAL DATA: Screening.

EXAM:
DIGITAL SCREENING BILATERAL MAMMOGRAM WITH TOMO AND CAD

[L MLO synth-2D]
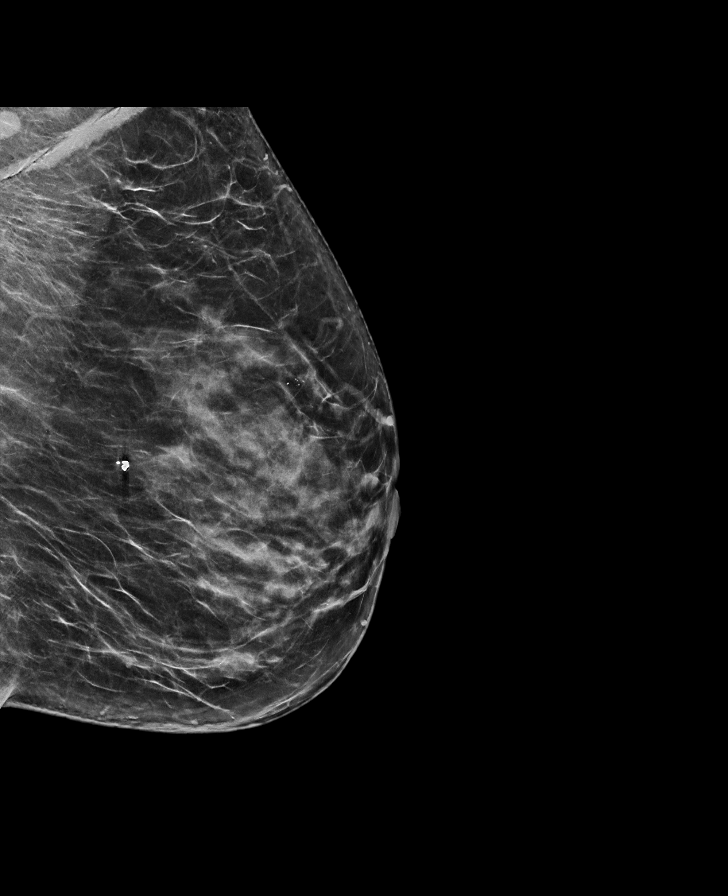

[L CC synth-2D]
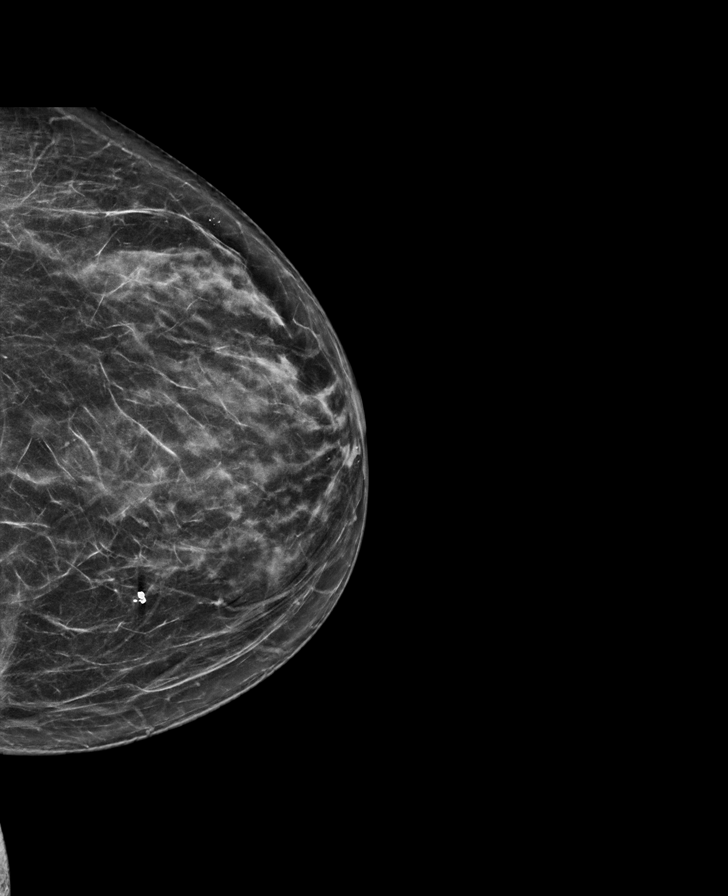

[R CC synth-2D]
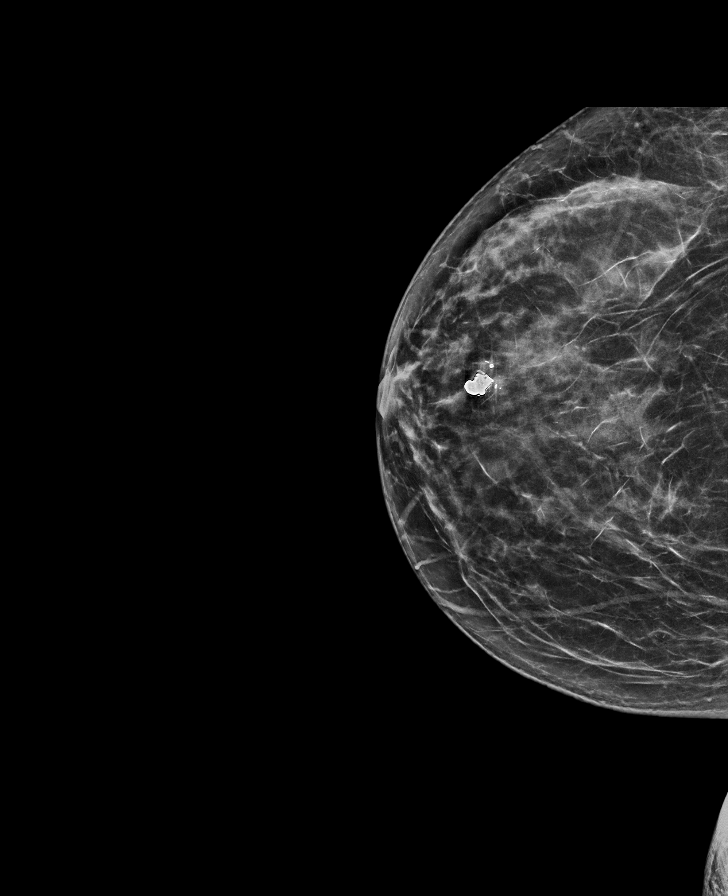

[R MLO synth-2D]
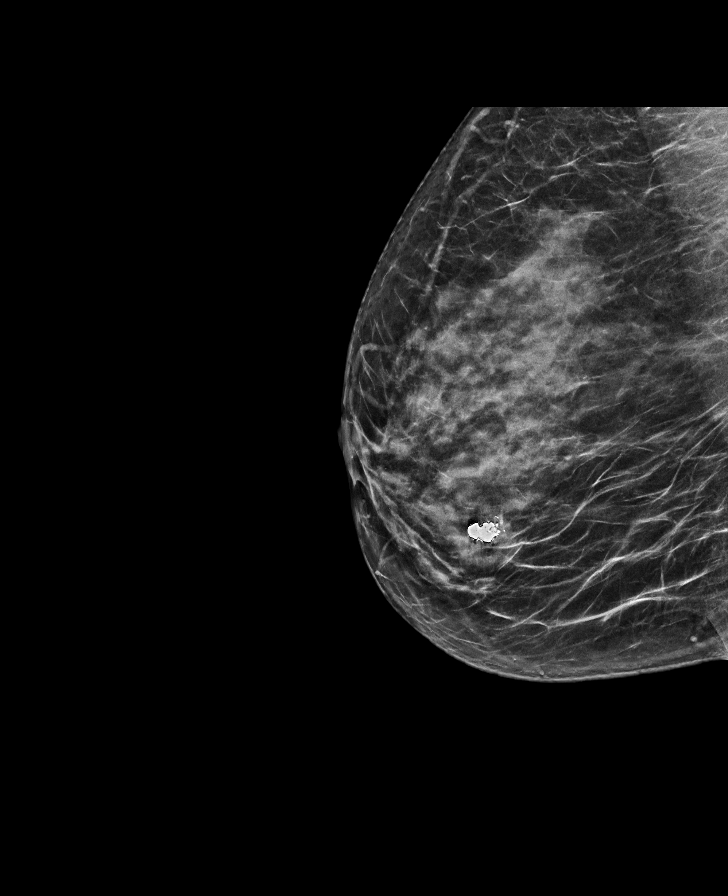

[R MLO tomo · tomo slice 34/67.0]
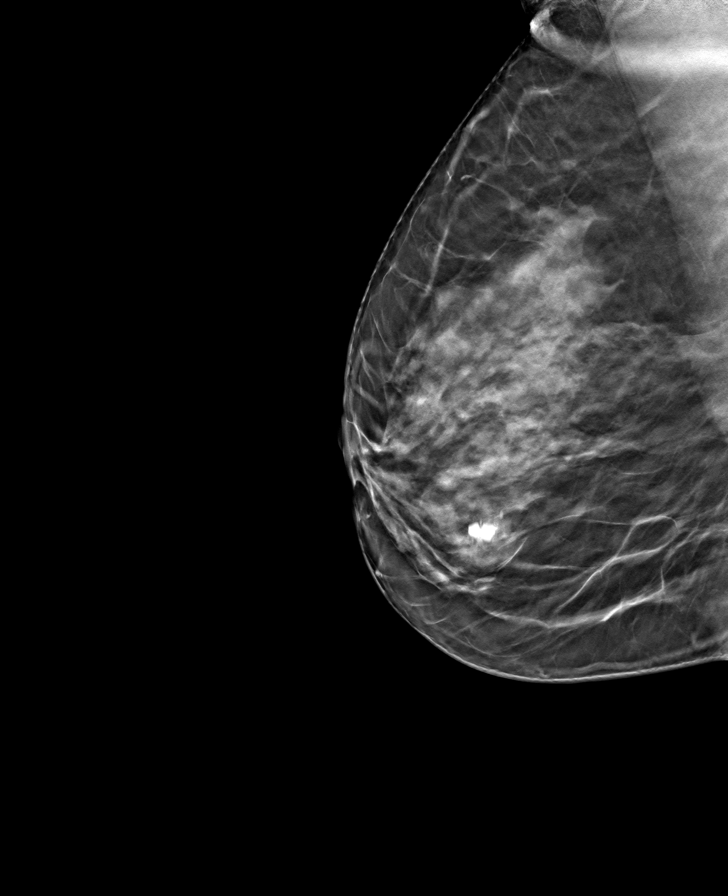

[L MLO tomo · tomo slice 37/72.0]
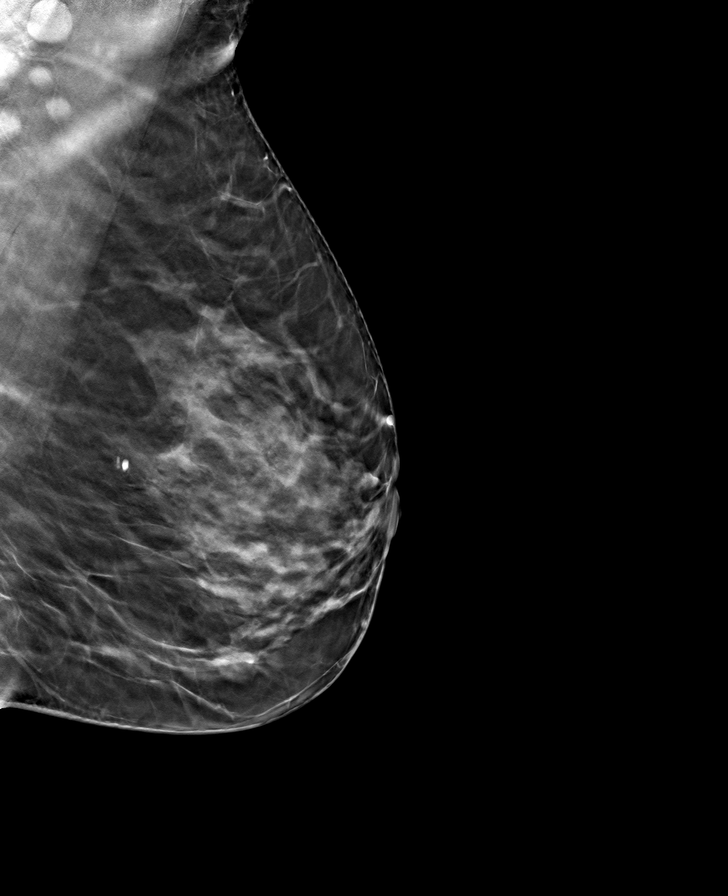

[L CC tomo · tomo slice 35/68.0]
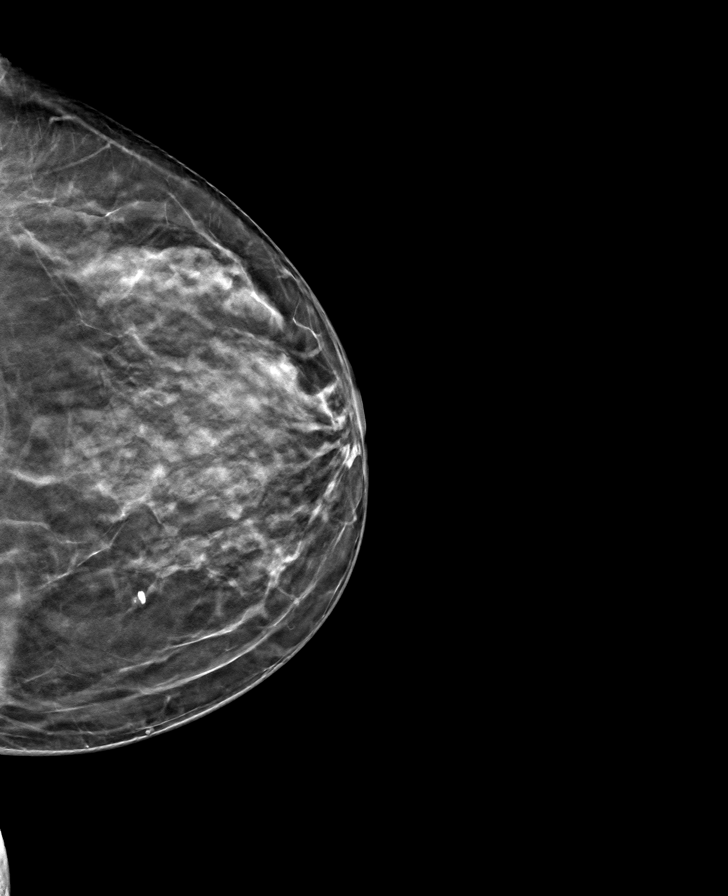

[R CC tomo · tomo slice 35/70.0]
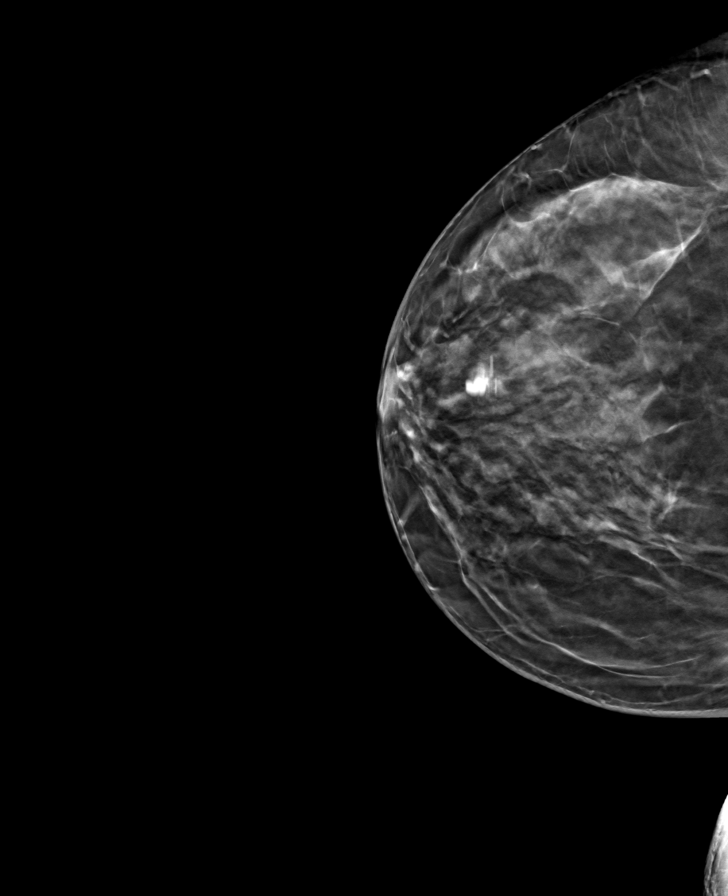

[8 of 24 positions shown; findings below may reference images not displayed]

ACR Breast Density Category c: The breast tissue is heterogeneously
dense, which may obscure small masses.
FINDINGS: There are no findings suspicious for malignancy. Images were
processed with CAD.
IMPRESSION: No mammographic evidence of malignancy. A result letter of this
screening mammogram will be mailed directly to the patient.

RECOMMENDATION:
Screening mammogram in one year. (Code:FT-[id number])

BI-RADS CATEGORY  1: Negative.

## 2020-09-16 ENCOUNTER — Other Ambulatory Visit: Payer: Self-pay | Admitting: Family Medicine

## 2020-09-16 ENCOUNTER — Other Ambulatory Visit: Payer: Self-pay

## 2020-09-16 DIAGNOSIS — Z1231 Encounter for screening mammogram for malignant neoplasm of breast: Secondary | ICD-10-CM

## 2020-10-28 ENCOUNTER — Ambulatory Visit: Payer: 59

## 2021-02-15 DIAGNOSIS — K635 Polyp of colon: Secondary | ICD-10-CM | POA: Insufficient documentation

## 2021-04-24 DIAGNOSIS — F5104 Psychophysiologic insomnia: Secondary | ICD-10-CM | POA: Insufficient documentation

## 2021-04-24 DIAGNOSIS — R350 Frequency of micturition: Secondary | ICD-10-CM | POA: Insufficient documentation

## 2021-04-24 DIAGNOSIS — R7303 Prediabetes: Secondary | ICD-10-CM | POA: Insufficient documentation

## 2021-04-24 DIAGNOSIS — E7849 Other hyperlipidemia: Secondary | ICD-10-CM | POA: Insufficient documentation

## 2021-04-24 DIAGNOSIS — F988 Other specified behavioral and emotional disorders with onset usually occurring in childhood and adolescence: Secondary | ICD-10-CM | POA: Insufficient documentation

## 2021-04-24 DIAGNOSIS — K5909 Other constipation: Secondary | ICD-10-CM | POA: Insufficient documentation

## 2023-02-14 ENCOUNTER — Encounter: Payer: 59 | Admitting: Obstetrics and Gynecology

## 2023-03-01 NOTE — Progress Notes (Unsigned)
GYNECOLOGY ANNUAL PREVENTATIVE CARE ENCOUNTER NOTE  History:     Shelly Castaneda is a 57 y.o. [phone] female here for a routine annual gynecologic exam.  Current complaints: none.  Desires annual surveillance labs.   Denies abnormal vaginal bleeding, discharge, pelvic pain, problems with intercourse or other gynecologic concerns.    Gynecologic History Patient's last menstrual period was 10/18/1993 (approximate). Last Pap: 2021. Result was normal with negative HPV Last Mammogram: 09/28/2019.  Result was normal Last Colonoscopy: 2015.  Result was benign, five year follow up recommended.  Obstetric History OB History  Gravida Para Term Preterm AB Living  3 1 1   2 1   SAB IAB Ectopic Multiple Live Births    1 1        # Outcome Date GA Lbr Len/2nd Weight Sex Delivery Anes PTL Lv  3 Ectopic 1995          2 Term 02/27/93          1 IAB 1994            Past Medical History:  Diagnosis Date   Abnormal Pap smear of cervix 2001   treated with colpo biopsy and cryotherapy - normal since   Chronic constipation    Migraine with aura mid teens   Thyroid disease 2004   hypothyroid    Past Surgical History:  Procedure Laterality Date   DILATION AND CURETTAGE OF UTERUS  1994   TAB   GYNECOLOGIC CRYOSURGERY  2001   TUBAL LIGATION  1995    Current Outpatient Medications on File Prior to Visit  Medication Sig Dispense Refill   levothyroxine (SYNTHROID, LEVOTHROID) 112 MCG tablet Take 100 mcg by mouth daily before breakfast.     No current facility-administered medications on file prior to visit.    No Known Allergies  Social History:  reports that she has never smoked. She has never used smokeless tobacco. She reports current alcohol use of about 2.0 standard drinks of alcohol per week. She reports that she does not use drugs.  Family History  Problem Relation Age of Onset   Diabetes Mother    Thyroid disease Mother    Dementia Mother    Stroke Mother     Hypertension Mother    Aneurysm Father    Heart attack Father    Hypertension Sister    Fibromyalgia Sister    Hypertension Brother    Heart attack Brother 41   Diabetes Brother    Heart disease Brother    Hypertension Brother    Diabetes Brother    Colon cancer Sister 81   Diabetes Brother    Stroke Maternal Grandmother    Diabetes Paternal Grandmother    Esophageal cancer Neg Hx    Rectal cancer Neg Hx    Stomach cancer Neg Hx     The following portions of the patient's history were reviewed and updated as appropriate: allergies, current medications, past family history, past medical history, past social history, past surgical history and problem list.  Review of Systems Pertinent items noted in HPI and remainder of comprehensive ROS otherwise negative.  Physical Exam:  BP (!) 164/89   Pulse 61   Ht 5\' 6"  (1.676 m)   Wt 166 lb (75.3 kg)   LMP 10/18/1993 (Approximate)   BMI 26.79 kg/m  Vitals:   03/03/23 1441 03/03/23 1522  BP: (!) 146/84 (!) 164/89   CONSTITUTIONAL: Well-developed, well-nourished female in no acute distress.  HENT:  Normocephalic, atraumatic,  External right and left ear normal.  EYES: Conjunctivae and EOM are normal. Pupils are equal, round, and reactive to light. No scleral icterus.  NECK: Normal range of motion, supple, no masses.  Normal thyroid.  SKIN: Skin is warm and dry. No rash noted. Not diaphoretic. No erythema. No pallor. MUSCULOSKELETAL: Normal range of motion. No tenderness.  No cyanosis, clubbing, or edema. NEUROLOGIC: Alert and oriented to person, place, and time. Normal reflexes, muscle tone coordination.  PSYCHIATRIC: Normal mood and affect. Normal behavior. Normal judgment and thought content. CARDIOVASCULAR: Normal heart rate noted, regular rhythm RESPIRATORY: Clear to auscultation bilaterally. Effort and breath sounds normal, no problems with respiration noted. BREASTS: Symmetric in size. No masses, tenderness, skin changes, nipple  drainage, or lymphadenopathy bilaterally. Performed in the presence of a chaperone. ABDOMEN: Soft, no distention noted.  No tenderness, rebound or guarding.  PELVIC: Normal appearing external genitalia and urethral meatus; normal appearing vaginal mucosa and cervix.  No abnormal vaginal discharge noted.  Pap smear obtained.  Normal uterine size, no other palpable masses, no uterine or adnexal tenderness.  Performed in the presence of a chaperone.   Assessment and Plan:     1. Essential hypertension 2. Family history of early CAD 3. Other specified hypothyroidism Referred to [facility] for further evaluation and management - Ambulatory referral to [facility]  4. Colon cancer screening Discussed need for colon cancer screening for five year follow up. Referral made to Gastroenterology for colonoscopy - Ambulatory referral to Gastroenterology  5. Breast cancer screening by mammogram Mammogram scheduled - MM 3D SCREENING MAMMOGRAM BILATERAL BREAST; Future  6. Well woman exam with routine gynecological exam Pap and preventative healthcare maintenance labs done, will follow up results and manage accordingly. - Cytology - PAP - Comprehensive metabolic panel - Thyroid Panel With TSH - CBC - Hemoglobin A1c - Lipid panel - Follicle stimulating hormone Routine preventative health maintenance measures emphasized. Please refer to After Visit Summary for other counseling recommendations.      Jaynie Collins, MD, FACOG Obstetrician & Gynecologist, [facility] for [employer], [facility]

## 2023-03-03 ENCOUNTER — Ambulatory Visit (INDEPENDENT_AMBULATORY_CARE_PROVIDER_SITE_OTHER): Payer: 59 | Admitting: Obstetrics & Gynecology

## 2023-03-03 ENCOUNTER — Encounter: Payer: Self-pay | Admitting: Obstetrics & Gynecology

## 2023-03-03 ENCOUNTER — Other Ambulatory Visit (HOSPITAL_COMMUNITY)
Admission: RE | Admit: 2023-03-03 | Discharge: 2023-03-03 | Disposition: A | Discharge status: Home / Self Care | Payer: 59 | Source: Ambulatory Visit | Attending: Obstetrics & Gynecology | Admitting: Obstetrics & Gynecology

## 2023-03-03 VITALS — BP 164/89 | HR 61 | Ht 66.0 in | Wt 166.0 lb

## 2023-03-03 DIAGNOSIS — I10 Essential (primary) hypertension: Secondary | ICD-10-CM | POA: Diagnosis not present

## 2023-03-03 DIAGNOSIS — Z01419 Encounter for gynecological examination (general) (routine) without abnormal findings: Secondary | ICD-10-CM | POA: Insufficient documentation

## 2023-03-03 DIAGNOSIS — Z1231 Encounter for screening mammogram for malignant neoplasm of breast: Secondary | ICD-10-CM

## 2023-03-03 DIAGNOSIS — E038 Other specified hypothyroidism: Secondary | ICD-10-CM

## 2023-03-03 DIAGNOSIS — Z8249 Family history of ischemic heart disease and other diseases of the circulatory system: Secondary | ICD-10-CM

## 2023-03-03 DIAGNOSIS — Z1211 Encounter for screening for malignant neoplasm of colon: Secondary | ICD-10-CM

## 2023-03-04 LAB — CBC
Hematocrit: 40.2 % (ref 34.0–46.6)
Hemoglobin: 12.7 g/dL (ref 11.1–15.9)
MCH: 27.6 pg (ref 26.6–33.0)
MCHC: 31.6 g/dL (ref 31.5–35.7)
MCV: 87 fL (ref 79–97)
Platelets: 267 10*3/uL (ref 150–450)
RBC: 4.6 x10E6/uL (ref 3.77–5.28)
RDW: 13.1 % (ref 11.7–15.4)
WBC: 5.6 10*3/uL (ref 3.4–10.8)

## 2023-03-04 LAB — HEMOGLOBIN A1C
Est. average glucose Bld gHb Est-mCnc: 131 mg/dL
Hgb A1c MFr Bld: 6.2 % — ABNORMAL HIGH (ref 4.8–5.6)

## 2023-03-04 LAB — COMPREHENSIVE METABOLIC PANEL
ALT: 10 IU/L (ref 0–32)
AST: 12 IU/L (ref 0–40)
Albumin/Globulin Ratio: 1.8 (ref 1.2–2.2)
Albumin: 4.4 g/dL (ref 3.8–4.9)
Alkaline Phosphatase: 63 IU/L (ref 44–121)
BUN/Creatinine Ratio: 27 — ABNORMAL HIGH (ref 9–23)
BUN: 24 mg/dL (ref 6–24)
Bilirubin Total: <0.2 mg/dL (ref 0.0–1.2)
CO2: 24 mmol/L (ref 20–29)
Calcium: 9.3 mg/dL (ref 8.7–10.2)
Chloride: 102 mmol/L (ref 96–106)
Creatinine, Ser: 0.88 mg/dL (ref 0.57–1.00)
Globulin, Total: 2.4 g/dL (ref 1.5–4.5)
Glucose: 89 mg/dL (ref 70–99)
Potassium: 4.5 mmol/L (ref 3.5–5.2)
Sodium: 139 mmol/L (ref 134–144)
Total Protein: 6.8 g/dL (ref 6.0–8.5)
eGFR: 76 mL/min/{1.73_m2} (ref >59)

## 2023-03-04 LAB — THYROID PANEL WITH TSH
Free Thyroxine Index: 1.5 (ref 1.2–4.9)
T3 Uptake Ratio: 29 % (ref 24–39)
T4, Total: 5.3 ug/dL (ref 4.5–12.0)
TSH: 1.33 u[IU]/mL (ref 0.450–4.500)

## 2023-03-04 LAB — LIPID PANEL
Chol/HDL Ratio: 3.5 ratio (ref 0.0–4.4)
Cholesterol, Total: 217 mg/dL — ABNORMAL HIGH (ref 100–199)
HDL: 62 mg/dL (ref >39)
LDL Chol Calc (NIH): 127 mg/dL — ABNORMAL HIGH (ref 0–99)
Triglycerides: 158 mg/dL — ABNORMAL HIGH (ref 0–149)
VLDL Cholesterol Cal: 28 mg/dL (ref 5–40)

## 2023-03-04 LAB — FOLLICLE STIMULATING HORMONE: FSH: 67.3 m[IU]/mL

## 2023-03-09 LAB — CYTOLOGY - PAP
Comment: NEGATIVE
Diagnosis: NEGATIVE
High risk HPV: NEGATIVE

## 2023-03-31 ENCOUNTER — Ambulatory Visit (INDEPENDENT_AMBULATORY_CARE_PROVIDER_SITE_OTHER): Payer: 59

## 2023-03-31 DIAGNOSIS — Z1231 Encounter for screening mammogram for malignant neoplasm of breast: Secondary | ICD-10-CM | POA: Diagnosis not present

## 2023-05-27 ENCOUNTER — Ambulatory Visit (INDEPENDENT_AMBULATORY_CARE_PROVIDER_SITE_OTHER): Payer: 59 | Admitting: Family Medicine

## 2023-05-27 DIAGNOSIS — Z91199 Patient's noncompliance with other medical treatment and regimen due to unspecified reason: Secondary | ICD-10-CM

## 2023-05-27 NOTE — Progress Notes (Signed)
No show

## 2023-06-21 ENCOUNTER — Encounter: Payer: Self-pay | Admitting: Family Medicine

## 2023-06-21 ENCOUNTER — Ambulatory Visit (INDEPENDENT_AMBULATORY_CARE_PROVIDER_SITE_OTHER): Payer: BC Managed Care – PPO | Admitting: Family Medicine

## 2023-06-21 VITALS — BP 160/92 | HR 61 | Ht 66.0 in | Wt 169.2 lb

## 2023-06-21 DIAGNOSIS — R7303 Prediabetes: Secondary | ICD-10-CM | POA: Diagnosis not present

## 2023-06-21 DIAGNOSIS — N898 Other specified noninflammatory disorders of vagina: Secondary | ICD-10-CM | POA: Insufficient documentation

## 2023-06-21 DIAGNOSIS — R350 Frequency of micturition: Secondary | ICD-10-CM

## 2023-06-21 DIAGNOSIS — Z23 Encounter for immunization: Secondary | ICD-10-CM | POA: Diagnosis not present

## 2023-06-21 DIAGNOSIS — I10 Essential (primary) hypertension: Secondary | ICD-10-CM | POA: Diagnosis not present

## 2023-06-21 DIAGNOSIS — E039 Hypothyroidism, unspecified: Secondary | ICD-10-CM | POA: Diagnosis not present

## 2023-06-21 DIAGNOSIS — Z1211 Encounter for screening for malignant neoplasm of colon: Secondary | ICD-10-CM

## 2023-06-21 LAB — POCT URINALYSIS DIP (CLINITEK)
Bilirubin, UA: NEGATIVE
Glucose, UA: NEGATIVE mg/dL
Ketones, POC UA: NEGATIVE mg/dL
Leukocytes, UA: NEGATIVE
Nitrite, UA: NEGATIVE
POC PROTEIN,UA: NEGATIVE
Spec Grav, UA: 1.025 (ref 1.010–1.025)
Urobilinogen, UA: 0.2 U/dL
pH, UA: 7.5 (ref 5.0–8.0)

## 2023-06-21 MED ORDER — LISINOPRIL 10 MG PO TABS
10.0000 mg | ORAL_TABLET | Freq: Every day | ORAL | 3 refills | Status: AC
Start: 2023-06-21 — End: ?

## 2023-06-21 NOTE — Assessment & Plan Note (Signed)
-   will get TSH and adjust synthroid as needed based on results

## 2023-06-21 NOTE — Assessment & Plan Note (Signed)
-   pt has been told she has high blood pressure in the past, will go ahead and treat for HTN given this hx and family hx as well - start lisinopril 10mg   - follow up in one month for nurse visit - microalbumin/cr ratio also ordered to evaluate for proteinuria

## 2023-06-21 NOTE — Progress Notes (Signed)
New patient visit   Patient: Shelly Castaneda   DOB: Oct 22, 1965   57 y.o. Female  MRN: [id number] Visit Date: 06/21/2023  Today's healthcare provider: Charlton Amor, DO   Chief Complaint  Patient presents with   New Patient (Initial Visit)    Establish care    SUBJECTIVE    Chief Complaint  Patient presents with   New Patient (Initial Visit)    Establish care   HPI HPI     New Patient (Initial Visit)    Additional comments: Establish care      Last edited by Roselyn Reef, CMA on 06/21/2023 10:55 AM.      Pt presents to establish care.  Pmh of High blood pressure - BP today is 160/92 not currently on medication  - denies chest pain, shortness of breath - strong family hx of htn in brother and sister  - has been told in the past she has elevated blood pressure  Pmh of prediabetes - needs updated A1c  Hx of hypothyroidism - on levothyroxine   Is also experiencing urinary frequency and vaginal discharge. Says she feels like she has a uti but nothing ever comes back positive.   Would like colon cancer screening.    Review of Systems  Constitutional:  Negative for activity change, fatigue and fever.  Respiratory:  Negative for cough and shortness of breath.   Cardiovascular:  Negative for chest pain.  Gastrointestinal:  Negative for abdominal pain.  Genitourinary:  Negative for difficulty urinating.       Current Meds  Medication Sig   levothyroxine (SYNTHROID, LEVOTHROID) 112 MCG tablet Take 100 mcg by mouth daily before breakfast.   lisinopril (ZESTRIL) 10 MG tablet Take 1 tablet (10 mg total) by mouth daily.    OBJECTIVE    BP (!) 160/92 (BP Location: Left Arm, Patient Position: Sitting, Cuff Size: Normal)   Pulse 61   Ht 5\' 6"  (1.676 m)   Wt 169 lb 4 oz (76.8 kg)   LMP 10/18/1993 (Approximate)   SpO2 100%   BMI 27.32 kg/m   Physical Exam Vitals and nursing note reviewed.  Constitutional:      General: She is not in acute  distress.    Appearance: Normal appearance.  HENT:     Head: Normocephalic and atraumatic.     Right Ear: External ear normal.     Left Ear: External ear normal.     Nose: Nose normal.  Eyes:     Conjunctiva/sclera: Conjunctivae normal.  Cardiovascular:     Rate and Rhythm: Normal rate and regular rhythm.  Pulmonary:     Effort: Pulmonary effort is normal.     Breath sounds: Normal breath sounds.  Abdominal:     General: Abdomen is flat. Bowel sounds are normal.     Palpations: Abdomen is soft.     Tenderness: There is no abdominal tenderness.  Neurological:     General: No focal deficit present.     Mental Status: She is alert and oriented to person, place, and time.  Psychiatric:        Mood and Affect: Mood normal.        Behavior: Behavior normal.        Thought Content: Thought content normal.        Judgment: Judgment normal.        ASSESSMENT & PLAN    Problem List Items Addressed This Visit       Cardiovascular and  Mediastinum   Primary hypertension - Primary    - pt has been told she has high blood pressure in the past, will go ahead and treat for HTN given this hx and family hx as well - start lisinopril 10mg   - follow up in one month for nurse visit - microalbumin/cr ratio also ordered to evaluate for proteinuria      Relevant Medications   lisinopril (ZESTRIL) 10 MG tablet   Other Relevant Orders   Basic Metabolic Panel (BMET)   Microalbumin / creatinine urine ratio     Endocrine   Hypothyroidism    - will get TSH and adjust synthroid as needed based on results       Relevant Orders   TSH     Other   Urinary frequency    POC UA done in clinic and is negative for infection - pt has had       Relevant Orders   POCT URINALYSIS DIP (CLINITEK) (Completed)   Urine Culture   Prediabetes    - will go ahead and get updated A1C      Relevant Orders   Basic Metabolic Panel (BMET)   HgB A1c   Vaginal discharge    - have ordered wet prep to  assess for vaginal discharge looking for possible yeast vs BV      Relevant Orders   WET PREP FOR TRICH, YEAST, CLUE   Other Visit Diagnoses     Screening for colon cancer       Relevant Orders   Ambulatory referral to Gastroenterology   Encounter for immunization       Relevant Orders   Flu vaccine trivalent PF, 6mos and older(Flulaval,Afluria,Fluarix,Fluzone) (Completed)       Return in about 4 weeks (around 07/19/2023) for nurse visit BP check.      Meds ordered this encounter  Medications   lisinopril (ZESTRIL) 10 MG tablet    Sig: Take 1 tablet (10 mg total) by mouth daily.    Dispense:  30 tablet    Refill:  3    Orders Placed This Encounter  Procedures   Urine Culture   WET PREP FOR TRICH, YEAST, CLUE   Flu vaccine trivalent PF, 6mos and older(Flulaval,Afluria,Fluarix,Fluzone)   Basic Metabolic Panel (BMET)   HgB A1c   TSH   Microalbumin / creatinine urine ratio   Ambulatory referral to Gastroenterology    Referral Priority:   Routine    Referral Type:   Consultation    Referral Reason:   Specialty Services Required    Number of Visits Requested:   1   POCT URINALYSIS DIP (CLINITEK)     Charlton Amor, DO  [facility] at [facility] [phone] (phone) [phone] (fax)  [facility]

## 2023-06-21 NOTE — Assessment & Plan Note (Signed)
-   will go ahead and get updated A1C

## 2023-06-21 NOTE — Assessment & Plan Note (Signed)
POC UA done in clinic and is negative for infection - pt has had

## 2023-06-21 NOTE — Assessment & Plan Note (Signed)
-   have ordered wet prep to assess for vaginal discharge looking for possible yeast vs BV

## 2023-06-22 ENCOUNTER — Telehealth: Payer: Self-pay | Admitting: Family Medicine

## 2023-06-22 LAB — BASIC METABOLIC PANEL
BUN/Creatinine Ratio: 17 (ref 9–23)
BUN: 13 mg/dL (ref 6–24)
CO2: 26 mmol/L (ref 20–29)
Calcium: 9 mg/dL (ref 8.7–10.2)
Chloride: 104 mmol/L (ref 96–106)
Creatinine, Ser: 0.78 mg/dL (ref 0.57–1.00)
Glucose: 95 mg/dL (ref 70–99)
Potassium: 4.4 mmol/L (ref 3.5–5.2)
Sodium: 141 mmol/L (ref 134–144)
eGFR: 87 mL/min/{1.73_m2} (ref >59)

## 2023-06-22 LAB — HEMOGLOBIN A1C
Est. average glucose Bld gHb Est-mCnc: 134 mg/dL
Hgb A1c MFr Bld: 6.3 % — ABNORMAL HIGH (ref 4.8–5.6)

## 2023-06-22 LAB — WET PREP FOR TRICH, YEAST, CLUE
Clue Cell Exam: NEGATIVE
Trichomonas Exam: NEGATIVE
Yeast Exam: NEGATIVE

## 2023-06-22 LAB — MICROALBUMIN / CREATININE URINE RATIO
Creatinine, Urine: 66.1 mg/dL
Microalb/Creat Ratio: <5 mg/g{creat} (ref 0–29)
Microalbumin, Urine: <3 ug/mL

## 2023-06-22 LAB — TSH: TSH: 2.27 u[IU]/mL (ref 0.450–4.500)

## 2023-06-22 NOTE — Telephone Encounter (Signed)
Patient called in wanting ann update on her lab work. Please advise

## 2023-06-22 NOTE — Telephone Encounter (Signed)
Pt responded to through MyChart. Roselyn Reef, CMA

## 2023-06-24 LAB — URINE CULTURE: Organism ID, Bacteria: NO GROWTH

## 2023-07-19 ENCOUNTER — Ambulatory Visit: Payer: Self-pay

## 2023-07-21 ENCOUNTER — Encounter: Payer: Self-pay | Admitting: Family Medicine

## 2023-07-21 ENCOUNTER — Ambulatory Visit: Payer: BC Managed Care – PPO | Admitting: Family Medicine

## 2023-07-21 VITALS — BP 143/80 | HR 68 | Ht 66.0 in | Wt 166.2 lb

## 2023-07-21 DIAGNOSIS — I10 Essential (primary) hypertension: Secondary | ICD-10-CM

## 2023-07-21 MED ORDER — LISINOPRIL 20 MG PO TABS
20.0000 mg | ORAL_TABLET | Freq: Every day | ORAL | 3 refills | Status: AC
Start: 2023-07-21 — End: ?

## 2023-07-21 NOTE — Assessment & Plan Note (Signed)
BP slightly elevated today, we will increase lisinopril to 20mg   - nurse visit in one month for BP check - follow up with me in two months for BP check and A1c check for prediabetes

## 2023-07-21 NOTE — Progress Notes (Signed)
Established patient visit   Patient: Shelly Castaneda   DOB: 02/24/1966   57 y.o. Female  MRN: [id number] Visit Date: 07/21/2023  Today's healthcare provider: Charlton Amor, DO   Chief Complaint  Patient presents with   Medical Management of Chronic Issues    HTN colonoscopy f/u    SUBJECTIVE    Chief Complaint  Patient presents with   Medical Management of Chronic Issues    HTN colonoscopy f/u   HPI HPI     Medical Management of Chronic Issues    Additional comments: HTN colonoscopy f/u      Last edited by Roselyn Reef, CMA on 07/21/2023  1:35 PM.      Pt presents for HTN follow up. Currently on lisinopril 10mg . BP slightly elevated at 143/80. Denies any chest pain, shortness of breath.   Review of Systems  Constitutional:  Negative for activity change, fatigue and fever.  Respiratory:  Negative for cough and shortness of breath.   Cardiovascular:  Negative for chest pain.  Gastrointestinal:  Negative for abdominal pain.  Genitourinary:  Negative for difficulty urinating.       Current Meds  Medication Sig   levothyroxine (SYNTHROID, LEVOTHROID) 112 MCG tablet Take 100 mcg by mouth daily before breakfast.   lisinopril (ZESTRIL) 20 MG tablet Take 1 tablet (20 mg total) by mouth daily.    OBJECTIVE    BP (!) 143/80 (BP Location: Left Arm, Patient Position: Sitting, Cuff Size: Normal)   Pulse 68   Ht 5\' 6"  (1.676 m)   Wt 166 lb 4 oz (75.4 kg)   LMP 10/18/1993 (Approximate)   SpO2 100%   BMI 26.83 kg/m   Physical Exam Vitals and nursing note reviewed.  Constitutional:      General: She is not in acute distress.    Appearance: Normal appearance.  HENT:     Head: Normocephalic and atraumatic.     Right Ear: External ear normal.     Left Ear: External ear normal.     Nose: Nose normal.  Eyes:     Conjunctiva/sclera: Conjunctivae normal.  Cardiovascular:     Rate and Rhythm: Normal rate and regular rhythm.  Pulmonary:     Effort:  Pulmonary effort is normal.     Breath sounds: Normal breath sounds.  Neurological:     General: No focal deficit present.     Mental Status: She is alert and oriented to person, place, and time.  Psychiatric:        Mood and Affect: Mood normal.        Behavior: Behavior normal.        Thought Content: Thought content normal.        Judgment: Judgment normal.        ASSESSMENT & PLAN    Problem List Items Addressed This Visit       Cardiovascular and Mediastinum   Primary hypertension - Primary    BP slightly elevated today, we will increase lisinopril to 20mg   - nurse visit in one month for BP check - follow up with me in two months for BP check and A1c check for prediabetes      Relevant Medications   lisinopril (ZESTRIL) 20 MG tablet    Return in about 2 months (around 09/20/2023) for for HTN follow up.      Meds ordered this encounter  Medications   lisinopril (ZESTRIL) 20 MG tablet    Sig: Take 1 tablet (20 mg  total) by mouth daily.    Dispense:  30 tablet    Refill:  3    No orders of the defined types were placed in this encounter.    Charlton Amor, DO  [facility] Medicine at [facility] [phone] (phone) [phone] (fax)  [facility]

## 2023-07-22 ENCOUNTER — Ambulatory Visit: Payer: BC Managed Care – PPO | Admitting: Family Medicine

## 2023-07-22 ENCOUNTER — Ambulatory Visit: Payer: Self-pay

## 2023-08-18 ENCOUNTER — Ambulatory Visit: Payer: BC Managed Care – PPO

## 2023-09-20 ENCOUNTER — Ambulatory Visit: Payer: BC Managed Care – PPO | Admitting: Family Medicine

## 2023-09-20 NOTE — Progress Notes (Unsigned)
     Established patient visit   Patient: Shelly Castaneda   DOB: 09-Aug-1966   57 y.o. Female  MRN: [id number] Visit Date: 09/20/2023  Today's healthcare provider: Charlton Amor, DO   No chief complaint on file.   SUBJECTIVE   No chief complaint on file.  HPI   Pt presents for HTN follow up and prediabetes check.   HTN - currently on lisinopril 20mg    Prediabetes  - last A1c was 6.3   Review of Systems     No outpatient medications have been marked as taking for the 09/20/23 encounter (Appointment) with Charlton Amor, DO.    OBJECTIVE    LMP 10/18/1993 (Approximate)   Physical Exam     ASSESSMENT & PLAN    Problem List Items Addressed This Visit   None   No follow-ups on file.      No orders of the defined types were placed in this encounter.   No orders of the defined types were placed in this encounter.    Charlton Amor, DO  [facility] at [facility] [phone] (phone) [phone] (fax)  [facility]

## 2023-10-20 ENCOUNTER — Encounter: Payer: Self-pay | Admitting: Obstetrics and Gynecology

## 2023-10-20 ENCOUNTER — Ambulatory Visit: Payer: BC Managed Care – PPO | Admitting: Obstetrics and Gynecology

## 2023-10-20 ENCOUNTER — Other Ambulatory Visit (HOSPITAL_COMMUNITY)
Admission: RE | Admit: 2023-10-20 | Discharge: 2023-10-20 | Disposition: A | Discharge status: Home / Self Care | Payer: BC Managed Care – PPO | Source: Ambulatory Visit | Attending: Obstetrics and Gynecology | Admitting: Obstetrics and Gynecology

## 2023-10-20 VITALS — BP 134/83 | HR 73 | Ht 66.0 in | Wt 168.0 lb

## 2023-10-20 DIAGNOSIS — R350 Frequency of micturition: Secondary | ICD-10-CM

## 2023-10-20 DIAGNOSIS — R1032 Left lower quadrant pain: Secondary | ICD-10-CM | POA: Diagnosis not present

## 2023-10-20 DIAGNOSIS — N898 Other specified noninflammatory disorders of vagina: Secondary | ICD-10-CM | POA: Diagnosis not present

## 2023-10-20 LAB — POCT URINALYSIS DIPSTICK
Bilirubin, UA: NEGATIVE
Glucose, UA: NEGATIVE
Nitrite, UA: NEGATIVE
Protein, UA: NEGATIVE
Spec Grav, UA: 1.025 (ref 1.010–1.025)
Urobilinogen, UA: 0.2 U/dL
pH, UA: 5 (ref 5.0–8.0)

## 2023-10-20 NOTE — Progress Notes (Signed)
   RETURN GYNECOLOGY VISIT  Subjective:  Shelly Castaneda is a menopausal 58 y.o. [id number] presenting for LLQ pain, vaginal discharge and urinary frequency  Reports severe left-sided abdominal pain x 2 days. The pain is localized to the front part of the abdomen and was so severe that she had to leave work early. The pain is sharp and is exacerbated by coughing, laughing, and deep breathing. The patient also reports feeling lightheaded and noticed changes in her urine, which was cloudy with an ammonia smell and later turned a pinkish color. The patient also reports increased vaginal discharge. The patient attempted to self-treat with a laxative, suspecting constipation, but this did not alleviate the pain. The patient has been able to eat and drink, but reports feeling nauseous when the pain intensifies. The patient also reports chills initially, but nothing current and she did not take her temperature.  Has hx kidney stones in the past.   Objective:   Vitals:   10/20/23 1332  BP: 134/83  Pulse: 73  Weight: 168 lb (76.2 kg)  Height: 5' 6 (1.676 m)    General:  Alert, oriented and cooperative. Patient is in no acute distress.  Skin: Skin is warm and dry. No rash noted.   Cardiovascular: Normal heart rate noted  Respiratory: Normal respiratory effort, no problems with respiration noted  Abdomen: Soft, nondistended. Mildly tender on left side at level of umbilicus without rebound or guarding Mild L CVA tenderness.  Pelvic: NEFG. No cervical motion, uterine, adnexal, or suprapubic tenderness.  Exam performed in the presence of a chaperone  Assessment and Plan:  Shelly Castaneda is a 58 y.o. with left sided abdominal pain  Left sided abdominal pain Frequent urination Vaginal discharge - Unclear etiology of her pain - exam with mid-abdominal tenderness and L flank tenderness. No e/o cystitis, PID, constipation, torsion/adnexal mass on exam. Urine dip with +blood, trace LE,  neg nitrite, neg protein.  - Does not appear to by a gyn cause of pain, suspect kidney stone. Pt is clinically well appearing and notes that she has significantly improved in the past 48 hours.  - Will send Ucx but defer labs/CT for now - Recommend aggressive PO hydration and tylenol /ibuprofen  prn - If symptoms persist/worsen, discussed PCP follow up - Reviewed indications for emergent care including fevers, persistent nausea/vomiting, or severe uncontrolled pain.  -     POCT Urinalysis Dipstick -     Urine Culture -     Cervicovaginal ancillary only( [facility]) -- declined GC/CT  Return if symptoms worsen or fail to improve.  Kieth JAYSON Carolin, MD

## 2023-10-20 NOTE — Progress Notes (Signed)
 Pt c/o LLQ pain, vaginal discharge and frequent urination

## 2023-10-21 LAB — CERVICOVAGINAL ANCILLARY ONLY
Bacterial Vaginitis (gardnerella): NEGATIVE
Candida Glabrata: NEGATIVE
Candida Vaginitis: NEGATIVE
Comment: NEGATIVE
Comment: NEGATIVE
Comment: NEGATIVE

## 2023-10-22 LAB — URINE CULTURE

## 2023-12-07 ENCOUNTER — Telehealth: Payer: Self-pay | Admitting: Family Medicine

## 2023-12-07 NOTE — Telephone Encounter (Signed)
 Copied from CRM [id number]. Topic: Clinical - Medication Refill >> Dec 07, 2023  9:12 AM Clide Dales wrote: Most Recent Primary Care Visit:  Provider: Charlton Amor  Department: [facility]  Visit Type: OFFICE VISIT  Date: 07/21/2023  Medication: levothyroxine (SYNTHROID, LEVOTHROID) 112 MCG tablet  Has the patient contacted their pharmacy? No Patient had no more refills and needed to contact provider.  Is this the correct pharmacy for this prescription? Yes If no, delete pharmacy and type the correct one.  This is the patient's preferred pharmacy:  [facility] - [place], [place] - [address]  Phone: [phone] Fax: [phone]  Has the prescription been filled recently? Yes  Is the patient out of the medication? Yes  Has the patient been seen for an appointment in the last year OR does the patient have an upcoming appointment? Yes  Can we respond through MyChart? Yes  Agent: Please be advised that Rx refills may take up to 3 business days. We ask that you follow-up with your pharmacy.

## 2023-12-08 ENCOUNTER — Other Ambulatory Visit: Payer: Self-pay

## 2023-12-08 MED ORDER — LEVOTHYROXINE SODIUM 112 MCG PO TABS: 112.0000 ug | ORAL_TABLET | Freq: Every day | ORAL | 1 refills | Status: DC

## 2024-01-03 ENCOUNTER — Other Ambulatory Visit: Payer: Self-pay | Admitting: Family Medicine

## 2024-01-03 NOTE — Telephone Encounter (Signed)
 Copied from CRM [id number]. Topic: Clinical - Medication Refill >> Jan 03, 2024  1:52 PM Nila Nephew wrote: Most Recent Primary Care Visit:  Provider: Charlton Amor  Department: [facility]  Visit Type: OFFICE VISIT  Date: 07/21/2023  Medication: levothyroxine (SYNTHROID) 112 MCG tablet - Patient requested a month ago and pharmacy states never received.   Has the patient contacted their pharmacy? Yes - states does not have it  Is this the correct pharmacy for this prescription? Yes  This is the patient's preferred pharmacy:   [facility] - [place], [place] - [address] [place] [address] Phone: [phone] Fax: [phone]   Has the prescription been filled recently? No  Is the patient out of the medication? Yes  Has the patient been seen for an appointment in the last year OR does the patient have an upcoming appointment? Yes  Can we respond through MyChart? Yes  Agent: Please be advised that Rx refills may take up to 3 business days. We ask that you follow-up with your pharmacy.

## 2024-01-04 ENCOUNTER — Telehealth: Payer: Self-pay

## 2024-01-04 ENCOUNTER — Encounter: Payer: Self-pay | Admitting: Physician Assistant

## 2024-01-04 MED ORDER — LEVOTHYROXINE SODIUM 112 MCG PO TABS: 112.0000 ug | ORAL_TABLET | Freq: Every day | ORAL | 0 refills | Status: DC

## 2024-01-04 NOTE — Telephone Encounter (Signed)
 Copied from CRM [id number]. Topic: Clinical - Request for Lab/Test Order >> Jan 04, 2024 12:10 PM Nila Nephew wrote: Reason for CRM: Patient requesting that lab order for physical be placed prior to her annual physical. Attempted to explain that orders are typically placed at time of appointment and reasoning, patient does not verbalize understanding and requests that it be placed for her to have drawn ahead of time regardless.

## 2024-01-04 NOTE — Telephone Encounter (Signed)
 Forwarding message to [facility] covering Dr. Tamera Punt.

## 2024-01-04 NOTE — Telephone Encounter (Signed)
 Copied from CRM [id number]. Topic: Clinical - Prescription Issue >> Jan 04, 2024 12:04 PM Nila Nephew wrote: Reason for CRM: Based on previous synthroid refill request - patient states that her dosage should be 100mg  instead of 112mg . Patient requesting it be resent at highest possible dosage.

## 2024-01-04 NOTE — Telephone Encounter (Signed)
 Forwarding message to [facility] covering DR. Tamera Punt.

## 2024-01-04 NOTE — Telephone Encounter (Signed)
 Copied from CRM [id number]. Topic: Clinical - Prescription Issue >> Jan 04, 2024  3:41 PM DeAngela L wrote: Reason for CRM: Patient call back number is [phone]  States she would like the name brand medication SYNTHROID and not the generic brand and not the MCG of 112 this was called in on 12/08/2023 with the wrong dosage and generic then called in again to a local pharmacy again generic and the wrong mcg and still incorrect, the Patient states her prescription should be 100 mcg and she wants the name brand sent to her preferred pharmacy of choice listed [facility] - [place], [place] - [address] [place] [address] Phone: [phone] Fax: [phone] This is a home delivery pharmacy and the price is cheaper for the patient to get the name brand hat she prefers, The pharmacy states if the office could call them now they could correct the prescription and quickly ship out the correct 100 mcg of the name brand medication This is urgent cause the patient only has two pills left

## 2024-01-04 NOTE — Telephone Encounter (Signed)
 Upcoming physical not scheduled until June 10th  with Dr. Tamera Punt. Forwarding message to [facility] covering [address].

## 2024-01-06 MED ORDER — LEVOTHYROXINE SODIUM 100 MCG PO TABS: 100.0000 ug | ORAL_TABLET | Freq: Every day | ORAL | 0 refills | Status: DC

## 2024-01-06 MED ORDER — SYNTHROID 100 MCG PO TABS: 100.0000 ug | ORAL_TABLET | Freq: Every day | ORAL | 0 refills | Status: DC

## 2024-01-09 ENCOUNTER — Other Ambulatory Visit: Payer: Self-pay

## 2024-01-09 DIAGNOSIS — I10 Essential (primary) hypertension: Secondary | ICD-10-CM

## 2024-01-09 DIAGNOSIS — E559 Vitamin D deficiency, unspecified: Secondary | ICD-10-CM

## 2024-01-09 DIAGNOSIS — E039 Hypothyroidism, unspecified: Secondary | ICD-10-CM

## 2024-01-09 DIAGNOSIS — E7849 Other hyperlipidemia: Secondary | ICD-10-CM

## 2024-01-09 NOTE — Telephone Encounter (Signed)
 Attempted call to patient. Left a detailed voice mail message on patient home # ( allowed on DPR )

## 2024-02-23 ENCOUNTER — Encounter: Payer: Self-pay | Admitting: Family Medicine

## 2024-03-06 ENCOUNTER — Telehealth: Payer: Self-pay | Admitting: *Deleted

## 2024-03-06 NOTE — Telephone Encounter (Signed)
Left patient a message to call and schedule annual. 

## 2024-03-27 ENCOUNTER — Encounter: Admitting: Family Medicine

## 2024-03-29 DIAGNOSIS — E7849 Other hyperlipidemia: Secondary | ICD-10-CM | POA: Diagnosis not present

## 2024-03-29 DIAGNOSIS — I10 Essential (primary) hypertension: Secondary | ICD-10-CM | POA: Diagnosis not present

## 2024-03-29 DIAGNOSIS — E039 Hypothyroidism, unspecified: Secondary | ICD-10-CM | POA: Diagnosis not present

## 2024-03-29 DIAGNOSIS — E559 Vitamin D deficiency, unspecified: Secondary | ICD-10-CM | POA: Diagnosis not present

## 2024-03-30 ENCOUNTER — Ambulatory Visit: Payer: Self-pay | Admitting: Physician Assistant

## 2024-03-30 NOTE — Progress Notes (Signed)
 You are seeing patient 6/16.

## 2024-04-02 ENCOUNTER — Encounter: Payer: Self-pay | Admitting: Family Medicine

## 2024-04-02 ENCOUNTER — Encounter: Payer: Self-pay | Admitting: Obstetrics & Gynecology

## 2024-04-02 ENCOUNTER — Ambulatory Visit: Admitting: Obstetrics & Gynecology

## 2024-04-02 ENCOUNTER — Ambulatory Visit (INDEPENDENT_AMBULATORY_CARE_PROVIDER_SITE_OTHER): Admitting: Family Medicine

## 2024-04-02 VITALS — BP 122/72 | HR 95 | Temp 98.0°F | Resp 18 | Ht 66.0 in | Wt 166.4 lb

## 2024-04-02 VITALS — BP 127/86 | HR 64 | Ht 66.0 in | Wt 166.0 lb

## 2024-04-02 DIAGNOSIS — Z0000 Encounter for general adult medical examination without abnormal findings: Secondary | ICD-10-CM

## 2024-04-02 DIAGNOSIS — Z01419 Encounter for gynecological examination (general) (routine) without abnormal findings: Secondary | ICD-10-CM | POA: Diagnosis not present

## 2024-04-02 DIAGNOSIS — E28319 Asymptomatic premature menopause: Secondary | ICD-10-CM

## 2024-04-02 NOTE — Progress Notes (Signed)
 Last pap smear (date and result):03/03/2023 WNL Last mammogram (date and result):03/31/2023 WNL Last colon screening (date and result):02/24/2021. Pt reported not due until 2027.  Brush:yes Floss:yes Seatbelts: yes Sunscreen: yes  Evelia Hipp, RN

## 2024-04-02 NOTE — Progress Notes (Signed)
 Complete physical exam  Patient: Shelly Castaneda   DOB: May 11, 1966   58 y.o. Female  MRN: [id number]  Subjective:    Chief Complaint  Patient presents with   Annual Exam    Shelly Castaneda is a 58 y.o. female who presents today for a complete physical exam. She reports consuming a general diet. Starting walking yesterday She generally feels fairly well. She reports sleeping fairly well. She does not have additional problems to discuss today.    Most recent fall risk assessment:     No data to display           Most recent depression screenings:     No data to display          Vision:Not within last year   Patient Active Problem List   Diagnosis Date Noted   Vaginal discharge 06/21/2023   Attention deficit disorder 04/24/2021   Chronic constipation 04/24/2021   Urinary frequency 04/24/2021   Other hyperlipidemia 04/24/2021   Prediabetes 04/24/2021   Psychophysiological insomnia 04/24/2021   Serrated polyp of colon 02/15/2021   Hypothyroidism 07/17/2020   History of MRSA infection 07/17/2020   Family history of early CAD 10/24/2019   Primary hypertension 10/24/2019   Screening for cardiovascular condition 09/24/2019   Elevated blood pressure reading in office without diagnosis of hypertension 09/24/2019   Back pain, acute 04/13/2015   Dysuria 04/13/2015   UTI (urinary tract infection) 11/06/2013   Migraines    Past Medical History:  Diagnosis Date   Abnormal Pap smear of cervix 2001   treated with colpo biopsy and cryotherapy - normal since   Chronic constipation    Migraine with aura mid teens   Thyroid  disease 2004   hypothyroid   Past Surgical History:  Procedure Laterality Date   DILATION AND CURETTAGE OF UTERUS  1994   TAB   GYNECOLOGIC CRYOSURGERY  2001   TUBAL LIGATION  1995   Social History   Socioeconomic History   Marital status: Divorced    Spouse name: Not on file   Number of children: 1   Years of education: Not on  file   Highest education level: Not on file  Occupational History   Not on file  Tobacco Use   Smoking status: Never   Smokeless tobacco: Never  Vaping Use   Vaping status: Never Used  Substance and Sexual Activity   Alcohol use: Yes    Alcohol/week: 2.0 standard drinks of alcohol    Types: 2 Standard drinks or equivalent per week   Drug use: No   Sexual activity: Yes    Partners: Male    Birth control/protection: Surgical, Post-menopausal    Comment: BTL  Other Topics Concern   Not on file  Social History Narrative   Not on file   Social Drivers of Health   Financial Resource Strain: Low Risk  (09/14/2022)   Received from [facility]   Overall Financial Resource Strain (CARDIA)    Difficulty of Paying Living Expenses: Not hard at all  Food Insecurity: No Food Insecurity (09/14/2022)   Received from [facility]   Hunger Vital Sign    Worried About Running Out of Food in the Last Year: Never true    Ran Out of Food in the Last Year: Never true  Transportation Needs: No Transportation Needs (08/21/2021)   Received from [facility] - Transportation    Lack of Transportation (Medical): No    Lack of Transportation (Non-Medical):  No  Physical Activity: Insufficiently Active (09/14/2022)   Received from [facility]   Exercise Vital Sign    Days of Exercise per Week: 2 days    Minutes of Exercise per Session: 30 min  Stress: Stress Concern Present (09/14/2022)   Received from [facility] - Occupational Stress Questionnaire    Feeling of Stress : To some extent  Social Connections: Socially Integrated (09/14/2022)   Received from [facility]   Social Network    How would you rate your social network (family, work, friends)?: Good participation with social networks  Intimate Partner Violence: Not At Risk (09/14/2022)   Received from [facility]   HITS    Over the last 12 months how often did your partner  physically hurt you?: Never    Over the last 12 months how often did your partner insult you or talk down to you?: Never    Over the last 12 months how often did your partner threaten you with physical harm?: Never    Over the last 12 months how often did your partner scream or curse at you?: Never   Family History  Problem Relation Age of Onset   Diabetes Mother    Thyroid  disease Mother    Dementia Mother    Stroke Mother    Hypertension Mother    Aneurysm Father    Heart attack Father    Hypertension Sister    Fibromyalgia Sister    Hypertension Brother    Heart attack Brother 40   Diabetes Brother    Heart disease Brother    Hypertension Brother    Diabetes Brother    Colon cancer Sister 3   Diabetes Brother    Stroke Maternal Grandmother    Diabetes Paternal Grandmother    Esophageal cancer Neg Hx    Rectal cancer Neg Hx    Stomach cancer Neg Hx    No Known Allergies    Patient Care Team: Manette Section, MD as PCP - General (Family Medicine)   Outpatient Medications Prior to Visit  Medication Sig   levothyroxine  (SYNTHROID ) 100 MCG tablet Take 1 tablet (100 mcg total) by mouth daily.   SYNTHROID  100 MCG tablet Take 1 tablet (100 mcg total) by mouth daily before breakfast.   lisinopril  (ZESTRIL ) 20 MG tablet Take 1 tablet (20 mg total) by mouth daily. (Patient not taking: Reported on 04/02/2024)   No facility-administered medications prior to visit.    Review of Systems  All other systems reviewed and are negative.        Objective:     BP 122/72   Pulse 95   Temp 98 F (36.7 C) (Oral)   Resp 18   Ht 5' 6 (1.676 m)   Wt 166 lb 6.4 oz (75.5 kg)   LMP 10/18/1993 (Approximate)   SpO2 95%   BMI 26.86 kg/m  BP Readings from Last 3 Encounters:  04/02/24 122/72  10/20/23 134/83  07/21/23 (!) 143/80      Physical Exam Vitals and nursing note reviewed.  Constitutional:      Appearance: Normal appearance. She is normal weight.  HENT:      Head: Normocephalic and atraumatic.     Right Ear: Tympanic membrane, ear canal and external ear normal.     Left Ear: Tympanic membrane, ear canal and external ear normal.     Nose: Nose normal.     Mouth/Throat:     Mouth: Mucous  membranes are moist.     Pharynx: Oropharynx is clear.   Eyes:     Conjunctiva/sclera: Conjunctivae normal.     Pupils: Pupils are equal, round, and reactive to light.    Cardiovascular:     Rate and Rhythm: Normal rate and regular rhythm.     Pulses: Normal pulses.     Heart sounds: Normal heart sounds.  Pulmonary:     Effort: Pulmonary effort is normal.     Breath sounds: Normal breath sounds.  Abdominal:     General: Abdomen is flat. Bowel sounds are normal.   Skin:    General: Skin is warm.     Capillary Refill: Capillary refill takes less than 2 seconds.   Neurological:     General: No focal deficit present.     Mental Status: She is alert and oriented to person, place, and time. Mental status is at baseline.   Psychiatric:        Mood and Affect: Mood normal.        Behavior: Behavior normal.        Thought Content: Thought content normal.        Judgment: Judgment normal.     No results found for any visits on 04/02/24. Last CBC Lab Results  Component Value Date   WBC 4.7 03/29/2024   HGB 12.9 03/29/2024   HCT 41.0 03/29/2024   MCV 91 03/29/2024   MCH 28.7 03/29/2024   RDW 13.6 03/29/2024   PLT 261 03/29/2024   Last metabolic panel Lab Results  Component Value Date   GLUCOSE 103 (H) 03/29/2024   NA 141 03/29/2024   K 5.1 03/29/2024   CL 104 03/29/2024   CO2 21 03/29/2024   BUN 22 03/29/2024   CREATININE 0.86 03/29/2024   EGFR 78 03/29/2024   CALCIUM 9.4 03/29/2024   PROT 6.9 03/29/2024   ALBUMIN 4.7 03/29/2024   LABGLOB 2.2 03/29/2024   AGRATIO 1.8 03/03/2023   BILITOT 0.2 03/29/2024   ALKPHOS 63 03/29/2024   AST 10 03/29/2024   ALT 10 03/29/2024   Last lipids Lab Results  Component Value Date   CHOL 232 (H)  03/29/2024   HDL 68 03/29/2024   LDLCALC 153 (H) 03/29/2024   TRIG 64 03/29/2024   CHOLHDL 3.4 03/29/2024   Last hemoglobin A1c Lab Results  Component Value Date   HGBA1C 6.3 (H) 06/21/2023   Last thyroid  functions Lab Results  Component Value Date   TSH 3.580 03/29/2024   T4TOTAL 5.3 03/03/2023        Assessment & Plan:    Routine Health Maintenance and Physical Exam  Immunization History  Administered Date(s) Administered   Influenza Inj Mdck Quad Pf 08/21/2021, 09/14/2022   Influenza Split 11/02/2011   Influenza, Seasonal, Injecte, Preservative Fre 06/21/2023   Influenza,inj,Quad PF,6+ Mos 07/19/2019, 07/17/2020   Influenza,inj,Quad PF,6-35 Mos 07/19/2019   Influenza-Unspecified 07/25/2013   Measles 12/27/1978   PFIZER Comirnaty(Gray Top)Covid-19 Tri-Sucrose Vaccine 10/30/2020   PFIZER(Purple Top)SARS-COV-2 Vaccination 11/26/2019, 12/17/2019   Rubella 12/27/1978   Tdap 04/22/2008, 09/14/2022   Zoster Recombinant(Shingrix) 08/07/2020, 01/20/2021    Health Maintenance  Topic Date Due   COVID-19 Vaccine (4 - 2024-25 season) 06/19/2023   INFLUENZA VACCINE  05/18/2024   MAMMOGRAM  03/30/2025   Colonoscopy  02/24/2026   Cervical Cancer Screening (HPV/Pap Cotest)  03/02/2028   DTaP/Tdap/Td (3 - Td or Tdap) 09/14/2032   Hepatitis C Screening  Completed   HIV Screening  Completed   Zoster Vaccines- Shingrix  Completed   HPV VACCINES  Aged Out   Meningococcal B Vaccine  Aged Out    Discussed health benefits of physical activity, and encouraged her to engage in regular exercise appropriate for her age and condition.  Problem List Items Addressed This Visit   None  No follow-ups on file. Annual physical exam   Pt with labs before her appointment. Reviewed today. Will work on HLD.  See back in 6 months sooner prn.    Manette Section, MD

## 2024-04-02 NOTE — Progress Notes (Signed)
 Subjective:     Shelly Castaneda is a 58 y.o. female here for a routine exam.  Current complaints: none   Gynecologic History Patient's last menstrual period was 10/18/1993 (approximate). Contraception: post menopausal status Last Pap: 5/24. Results were: normal Last mammogram: 6/24. Results were: normal  Obstetric History OB History  Gravida Para Term Preterm AB Living  3 1 1  2 1   SAB IAB Ectopic Multiple Live Births   1 1  1     # Outcome Date GA Lbr Len/2nd Weight Sex Type Anes PTL Lv  3 Ectopic 1995          2 Term 02/27/93          1 IAB 1994             The following portions of the patient's history were reviewed and updated as appropriate: allergies, current medications, past family history, past medical history, past social history, past surgical history, and problem list.  Review of Systems Pertinent items are noted in HPI.    Objective:     Vitals:   04/02/24 1404  BP: 127/86  Pulse: 64  Weight: 166 lb (75.3 kg)  Height: 5' 6 (1.676 m)   Vitals:  WNL General appearance: alert, cooperative and no distress  HEENT: Normocephalic, without obvious abnormality, atraumatic Eyes: negative Throat: lips, mucosa, and tongue normal; teeth and gums normal  Respiratory: Clear to auscultation bilaterally  CV: Regular rate and rhythm  Breasts:  Normal appearance, no masses or tenderness, no nipple retraction or dimpling  GI: Soft, non-tender; bowel sounds normal; no masses,  no organomegaly  GU: External Genitalia:  Tanner V, no lesion Urethra:  No prolapse   Vagina: Pink, normal rugae, no blood or discharge  Cervix: No CMT, no lesion  Uterus:  Normal size and contour, non tender  Adnexa: Normal, no masses, non tender  Musculoskeletal: No edema, redness or tenderness in the calves or thighs  Skin: Multiple nevi and SKs--pts states moles are growing  Lymphatic: Axillary adenopathy: none     Psychiatric: Normal mood and behavior      Assessment:     Healthy female exam.    Plan:    1.  Pap up to date   2.  Yearly Mammogram 3.  Colon cancer screening:Due in 2027 4.  Maximize calcium and vitamin D  intake for bone health. Last Dexa 12 years ago; has hx of being on depo just prior to menopause.  Will repeat 5.  Advise derm skin cancer screening and to wear sunscreen daily.

## 2024-05-02 ENCOUNTER — Ambulatory Visit

## 2024-05-10 ENCOUNTER — Ambulatory Visit (HOSPITAL_BASED_OUTPATIENT_CLINIC_OR_DEPARTMENT_OTHER)
Admission: RE | Admit: 2024-05-10 | Discharge: 2024-05-10 | Disposition: A | Discharge status: Home / Self Care | Source: Ambulatory Visit | Attending: Obstetrics & Gynecology | Admitting: Obstetrics & Gynecology

## 2024-05-10 ENCOUNTER — Encounter (HOSPITAL_BASED_OUTPATIENT_CLINIC_OR_DEPARTMENT_OTHER): Payer: Self-pay

## 2024-05-10 DIAGNOSIS — Z01419 Encounter for gynecological examination (general) (routine) without abnormal findings: Secondary | ICD-10-CM | POA: Diagnosis not present

## 2024-05-10 DIAGNOSIS — Z1231 Encounter for screening mammogram for malignant neoplasm of breast: Secondary | ICD-10-CM | POA: Diagnosis not present

## 2024-05-10 DIAGNOSIS — R928 Other abnormal and inconclusive findings on diagnostic imaging of breast: Secondary | ICD-10-CM | POA: Insufficient documentation

## 2024-05-16 ENCOUNTER — Other Ambulatory Visit: Payer: Self-pay | Admitting: Obstetrics & Gynecology

## 2024-05-16 DIAGNOSIS — R928 Other abnormal and inconclusive findings on diagnostic imaging of breast: Secondary | ICD-10-CM

## 2024-05-17 ENCOUNTER — Ambulatory Visit: Payer: Self-pay | Admitting: Obstetrics & Gynecology

## 2024-05-23 ENCOUNTER — Ambulatory Visit: Payer: Self-pay | Admitting: Obstetrics & Gynecology

## 2024-05-23 ENCOUNTER — Ambulatory Visit
Admission: RE | Admit: 2024-05-23 | Discharge: 2024-05-23 | Disposition: A | Discharge status: Home / Self Care | Source: Ambulatory Visit | Attending: Obstetrics & Gynecology | Admitting: Obstetrics & Gynecology

## 2024-05-23 DIAGNOSIS — R92332 Mammographic heterogeneous density, left breast: Secondary | ICD-10-CM | POA: Diagnosis not present

## 2024-05-23 DIAGNOSIS — R928 Other abnormal and inconclusive findings on diagnostic imaging of breast: Secondary | ICD-10-CM

## 2024-07-13 ENCOUNTER — Other Ambulatory Visit: Payer: Self-pay | Admitting: Physician Assistant

## 2024-07-13 ENCOUNTER — Other Ambulatory Visit: Payer: Self-pay | Admitting: Family Medicine

## 2024-07-13 NOTE — Telephone Encounter (Signed)
 Copied from CRM [id number]. Topic: Clinical - Medication Refill >> Jul 13, 2024  2:32 PM Darshell M wrote: Patient wants to get all the medication remaining to avoid the increased  price due to tariffs. Patient normally receives a 90 day supply  Medication:  levothyroxine  (SYNTHROID ) 100 MCG tablet   Has the patient contacted their pharmacy? Yes (Agent: If no, request that the patient contact the pharmacy for the refill. If patient does not wish to contact the pharmacy document the reason why and proceed with request.) (Agent: If yes, when and what did the pharmacy advise?)  This is the patient's preferred pharmacy:   [facility] - [place], [place] - [address] [place] [address] Phone: [phone] Fax: [phone]  Is this the correct pharmacy for this prescription? Yes If no, delete pharmacy and type the correct one.   Has the prescription been filled recently? No  Is the patient out of the medication? No  Has the patient been seen for an appointment in the last year OR does the patient have an upcoming appointment? Yes  Can we respond through MyChart? Yes  Agent: Please be advised that Rx refills may take up to 3 business days. We ask that you follow-up with your pharmacy.

## 2024-07-16 MED ORDER — LEVOTHYROXINE SODIUM 100 MCG PO TABS: 100.0000 ug | ORAL_TABLET | Freq: Every day | ORAL | 0 refills | Status: DC

## 2024-08-07 ENCOUNTER — Emergency Department (HOSPITAL_BASED_OUTPATIENT_CLINIC_OR_DEPARTMENT_OTHER)

## 2024-08-07 ENCOUNTER — Encounter (HOSPITAL_BASED_OUTPATIENT_CLINIC_OR_DEPARTMENT_OTHER): Payer: Self-pay

## 2024-08-07 ENCOUNTER — Other Ambulatory Visit: Payer: Self-pay

## 2024-08-07 ENCOUNTER — Emergency Department (HOSPITAL_BASED_OUTPATIENT_CLINIC_OR_DEPARTMENT_OTHER)
Admission: EM | Admit: 2024-08-07 | Discharge: 2024-08-07 | Discharge status: Against Medical Advice | Attending: Emergency Medicine | Admitting: Emergency Medicine

## 2024-08-07 ENCOUNTER — Ambulatory Visit: Payer: Self-pay

## 2024-08-07 DIAGNOSIS — R0789 Other chest pain: Secondary | ICD-10-CM | POA: Insufficient documentation

## 2024-08-07 DIAGNOSIS — R0602 Shortness of breath: Secondary | ICD-10-CM | POA: Diagnosis not present

## 2024-08-07 DIAGNOSIS — Z5321 Procedure and treatment not carried out due to patient leaving prior to being seen by health care provider: Secondary | ICD-10-CM | POA: Insufficient documentation

## 2024-08-07 DIAGNOSIS — R42 Dizziness and giddiness: Secondary | ICD-10-CM | POA: Diagnosis not present

## 2024-08-07 DIAGNOSIS — R112 Nausea with vomiting, unspecified: Secondary | ICD-10-CM | POA: Diagnosis not present

## 2024-08-07 DIAGNOSIS — R200 Anesthesia of skin: Secondary | ICD-10-CM | POA: Diagnosis not present

## 2024-08-07 LAB — CBC
HCT: 36.2 % (ref 36.0–46.0)
Hemoglobin: 11.8 g/dL — ABNORMAL LOW (ref 12.0–15.0)
MCH: 28 pg (ref 26.0–34.0)
MCHC: 32.6 g/dL (ref 30.0–36.0)
MCV: 85.8 fL (ref 80.0–100.0)
Platelets: 265 K/uL (ref 150–400)
RBC: 4.22 MIL/uL (ref 3.87–5.11)
RDW: 13.8 % (ref 11.5–15.5)
WBC: 6.3 K/uL (ref 4.0–10.5)
nRBC: 0 % (ref 0.0–0.2)

## 2024-08-07 LAB — BASIC METABOLIC PANEL WITH GFR
Anion gap: 11 (ref 5–15)
BUN: 17 mg/dL (ref 6–20)
CO2: 24 mmol/L (ref 22–32)
Calcium: 9.1 mg/dL (ref 8.9–10.3)
Chloride: 104 mmol/L (ref 98–111)
Creatinine, Ser: 0.88 mg/dL (ref 0.44–1.00)
GFR, Estimated: >60 mL/min (ref >60)
Glucose, Bld: 103 mg/dL — ABNORMAL HIGH (ref 70–99)
Potassium: 4.3 mmol/L (ref 3.5–5.1)
Sodium: 139 mmol/L (ref 135–145)

## 2024-08-07 LAB — TROPONIN T, HIGH SENSITIVITY: Troponin T High Sensitivity: <15 ng/L (ref 0–19)

## 2024-08-07 NOTE — ED Triage Notes (Signed)
 Generalized chest pain, R arm numbness, dizziness. Symptoms for about 1 month.  Intermittent SHOB and N/V

## 2024-08-07 NOTE — Telephone Encounter (Signed)
 FYI Only or Action Required?: FYI only for provider.  Patient was last seen in primary care on 04/02/2024 by Shelly Torrence GRADE, MD.  Called Nurse Triage reporting Chest Pain, Dizziness, and Blurred Vision.  Symptoms began about a month ago.  Interventions attempted: Nothing.  Symptoms are: unchanged.  Triage Disposition: Go to ED Now (Notify PCP)  Patient/caregiver understands and will follow disposition?: Unsure           Copied from CRM [id number]. Topic: Clinical - Red Word Triage >> Aug 07, 2024 11:35 AM Shelly Castaneda wrote: Red Word that prompted transfer to Nurse Triage: Buzzy chest area, numbness, dizzyness, lightheaded, ache and pain in chest area, feel like going to pass out in last two months, tingling in fingers, back of knee is swelling, blurred vision Reason for Disposition  [1] Numbness (i.e., loss of sensation) of the face, arm / hand, or leg / foot on one side of the body AND [2] sudden onset AND [3] brief (now gone)    Triager strongly advised evaluation at ED. Triager also reviewed importance of being evaluated ASAP. Pt reports she will go to [facility].  Answer Assessment - Initial Assessment Questions 1. LOCATION: Where does it hurt?       Vibrating feeling in my chest 2. RADIATION: Does the pain go anywhere else? (e.g., into neck, jaw, arms, back)     *No Answer* 3. ONSET: When did the chest pain begin? (Minutes, hours or days)      X 1 mo 4. PATTERN: Does the pain come and go, or has it been constant since it started?  Does it get worse with exertion?      Comes and goes, last event last Saturday 5. DURATION: How long does it last (e.g., seconds, minutes, hours)     A few minutes 6. SEVERITY: How bad is the pain?  (e.g., Scale 1-10; mild, moderate, or severe)     *No Answer* 7. CARDIAC RISK FACTORS: Do you have any history of heart problems or risk factors for heart disease? (e.g., angina, prior heart attack; diabetes, high blood  pressure, high cholesterol, smoker, or strong family history of heart disease)     *No Answer* 8. PULMONARY RISK FACTORS: Do you have any history of lung disease?  (e.g., blood clots in lung, asthma, emphysema, birth control pills)     *No Answer* 9. CAUSE: What do you think is causing the chest pain?     *No Answer* 10. OTHER SYMPTOMS: Do you have any other symptoms? (e.g., dizziness, nausea, vomiting, sweating, fever, difficulty breathing, cough)       Heavy breathing, back of R knee swelling, R sided numbness, dizziness, intermittent blurry vision 11. PREGNANCY: Is there any chance you are pregnant? When was your last menstrual period?       *No Answer*  Protocols used: Chest Pain-A-AH, Neurologic Deficit-A-AH

## 2024-08-08 ENCOUNTER — Ambulatory Visit: Payer: Self-pay

## 2024-08-08 NOTE — Telephone Encounter (Signed)
 Patient is scheduled for 08/09/2024 @2 :10pm for a Hospital Follow-up appointment.

## 2024-08-08 NOTE — Telephone Encounter (Signed)
 FYI Only or Action Required?: FYI only for provider.  Patient was last seen in primary care on 04/02/2024 by Colette Torrence GRADE, MD.  Called Nurse Triage reporting Advice Only. History of nausea, vomiting, chest pain, SOB  Symptoms began about a month ago.  Interventions attempted: Other: ED on 10/21.  Symptoms are: completely resolved.  Triage Disposition: See PCP Within 2 Weeks  Patient/caregiver understands and will follow disposition?: Yes  Copied from CRM [id number]. Topic: Clinical - Red Word Triage >> Aug 08, 2024 10:54 AM Shelly Castaneda wrote: [name] that prompted transfer to Nurse Triage: Patient spoke to NT  and is still having issues ,with dizziness and pain, and said ER found things wrong, and needing to speak to NT again with symptoms Reason for Disposition  Requesting regular office appointment  Answer Assessment - Initial Assessment Questions 1. REASON FOR CALL: What is the main reason for your call? or How can I best help you?     Schedule ED follow up appointment 2. SYMPTOMS : Do you have any symptoms?      Denies any symptoms at time of call 3. OTHER QUESTIONS: Do you have any other questions?     denies  Protocols used: Information Only Call - No Triage-A-AH

## 2024-08-09 ENCOUNTER — Ambulatory Visit: Admitting: Family Medicine

## 2024-08-09 ENCOUNTER — Encounter: Payer: Self-pay | Admitting: Family Medicine

## 2024-08-09 VITALS — BP 112/67 | HR 77 | Temp 98.2°F | Ht 66.0 in | Wt 168.9 lb

## 2024-08-09 DIAGNOSIS — R9431 Abnormal electrocardiogram [ECG] [EKG]: Secondary | ICD-10-CM | POA: Diagnosis not present

## 2024-08-09 DIAGNOSIS — R0602 Shortness of breath: Secondary | ICD-10-CM | POA: Insufficient documentation

## 2024-08-09 DIAGNOSIS — Z8614 Personal history of Methicillin resistant Staphylococcus aureus infection: Secondary | ICD-10-CM | POA: Diagnosis not present

## 2024-08-09 NOTE — Assessment & Plan Note (Signed)
Blood culture ordered.

## 2024-08-09 NOTE — Assessment & Plan Note (Signed)
 EKG with NSR with R wave progression. Needs to be evaluated by cardiology.

## 2024-08-09 NOTE — Assessment & Plan Note (Signed)
 No obvious shortness of breath in room today. Reports symptoms are worse with brushing her teeth in the morning. Abnormal EKG in ED. Family history of CAD. D-dimer  Understands if d-dimer is positive, she will need to go to ED.  Referral to cardiology for evaluation.  CBC on 10/21 with hemoglobin 11.8, otherwise normal.  Follow-up based on results. Understands if symptoms worsen to return to ED immediately.

## 2024-08-09 NOTE — Progress Notes (Signed)
 Established Patient Office Visit  Subjective   Patient ID: Shelly Castaneda, female    DOB: April 27, 1966  Age: 58 y.o. MRN: [id number]  Chief Complaint  Patient presents with   ER Follow up    SOB,nausea, right arm numbness. Wants to follow up on results of test from ER on [address]    History of MRSA on face. Concerned that there is MRSA in the blood. Will get blood culture today.   Went to ED on 10/21 with n/v/SOB for one month. Neg troponin. CBC with hemoglobin 11.8 EKG in the ED shows NSR with R wave progression.  Chest x-ray normal on 10/21 Needs cards referral for evaluation.    Continues to have  SOB worse in the morning with brushing her teeth. Feels like she is going to pass out. Reports numbness popliteal fossa on right.  No calf pain. Wonders if she has a blood clot.  Will get d-dimer today.          ROS    Objective:     BP 112/67   Pulse 77   Temp 98.2 F (36.8 C) (Oral)   Ht 5' 6 (1.676 m)   Wt 168 lb 14.4 oz (76.6 kg)   LMP 10/18/1993 (Approximate)   SpO2 99%   BMI 27.26 kg/m    Physical Exam Vitals and nursing note reviewed.  Constitutional:      General: She is not in acute distress.    Appearance: Normal appearance.  Cardiovascular:     Rate and Rhythm: Normal rate and regular rhythm.     Heart sounds: Normal heart sounds.  Pulmonary:     Effort: Pulmonary effort is normal.     Breath sounds: Normal breath sounds.  Skin:    General: Skin is warm and dry.  Neurological:     General: No focal deficit present.     Mental Status: She is alert. Mental status is at baseline.  Psychiatric:        Mood and Affect: Mood normal.        Behavior: Behavior normal.        Thought Content: Thought content normal.        Judgment: Judgment normal.      No results found for any visits on 08/09/24.    The 10-year ASCVD risk score (Arnett DK, et al., 2019) is: 3.6%    Assessment & Plan:   Problem List Items  Addressed This Visit     History of MRSA infection - Primary   Blood culture ordered.       Relevant Orders   Blood culture (routine single)   Shortness of breath   No obvious shortness of breath in room today. Reports symptoms are worse with brushing her teeth in the morning. Abnormal EKG in ED. Family history of CAD. D-dimer  Understands if d-dimer is positive, she will need to go to ED.  Referral to cardiology for evaluation.  CBC on 10/21 with hemoglobin 11.8, otherwise normal.  Follow-up based on results. Understands if symptoms worsen to return to ED immediately.        Relevant Orders   D-Dimer, Quantitative   Abnormal finding on EKG   EKG with NSR with R wave progression. Needs to be evaluated by cardiology.        Relevant Orders   Ambulatory referral to Cardiology  Agrees with plan of care discussed.  Questions answered.   Return if symptoms worsen or fail to improve.  Darice JONELLE Brownie, FNP

## 2024-08-10 ENCOUNTER — Ambulatory Visit: Payer: Self-pay | Admitting: Family Medicine

## 2024-08-10 LAB — D-DIMER, QUANTITATIVE: D-DIMER: <0.2 mg{FEU}/L (ref 0.00–0.49)

## 2024-08-15 LAB — CULTURE, BLOOD (SINGLE)

## 2024-08-29 ENCOUNTER — Other Ambulatory Visit (HOSPITAL_COMMUNITY): Payer: Self-pay

## 2024-08-29 ENCOUNTER — Ambulatory Visit
Attending: Student in an Organized Health Care Education/Training Program | Admitting: Student in an Organized Health Care Education/Training Program

## 2024-08-29 VITALS — BP 145/84 | HR 54 | Ht 66.0 in | Wt 171.0 lb

## 2024-08-29 DIAGNOSIS — R030 Elevated blood-pressure reading, without diagnosis of hypertension: Secondary | ICD-10-CM

## 2024-08-29 DIAGNOSIS — R42 Dizziness and giddiness: Secondary | ICD-10-CM | POA: Diagnosis not present

## 2024-08-29 DIAGNOSIS — R9431 Abnormal electrocardiogram [ECG] [EKG]: Secondary | ICD-10-CM | POA: Diagnosis not present

## 2024-08-29 DIAGNOSIS — E782 Mixed hyperlipidemia: Secondary | ICD-10-CM | POA: Diagnosis not present

## 2024-08-29 MED ORDER — OMRON 3 SERIES BP MONITOR DEVI: 0 refills | Status: DC

## 2024-08-29 MED ORDER — OMRON 3 SERIES BP MONITOR DEVI
0 refills | Status: AC
  Filled 2024-08-29: qty 1, 30d supply, fill #0

## 2024-08-29 NOTE — Patient Instructions (Addendum)
 Medication Instructions:  Your physician recommends that you continue on your current medications as directed. Please refer to the Current Medication list given to you today.  *If you need a refill on your cardiac medications before your next appointment, please call your pharmacy*  Lab Work: Have lab work done today in the lab on the first floor--Lp(a) If you have labs (blood work) drawn today and your tests are completely normal, you will receive your results only by: MyChart Message (if you have MyChart) OR A paper copy in the mail If you have any lab test that is abnormal or we need to change your treatment, we will call you to review the results.  Testing/Procedures: none  Follow-Up: At [facility], you and your health needs are our priority.  As part of our continuing mission to provide you with exceptional heart care, our providers are all part of one team.  This team includes your primary Cardiologist (physician) and Advanced Practice Providers or APPs (Physician Assistants and Nurse Practitioners) who all work together to provide you with the care you need, when you need it.  Your next appointment:   12 month(s)  Provider:   Georganna Archer, MD    We recommend signing up for the patient portal called MyChart.  Sign up information is provided on this After Visit Summary.  MyChart is used to connect with patients for Virtual Visits (Telemedicine).  Patients are able to view lab/test results, encounter notes, upcoming appointments, etc.  Non-urgent messages can be sent to your provider as well.   To learn more about what you can do with MyChart, go to [email or web address].   Other Instructions Check blood pressure twice daily for 2 weeks and then send readings through my chart or call to office       Blood Pressure Record Sheet To take your blood pressure, you will need a blood pressure machine. You may be prescribed one, or you can buy a blood pressure machine  (blood pressure monitor) at your clinic, drug store, or online. When choosing one, look for these features: An automatic monitor that has an arm cuff. A cuff that wraps snugly, but not too tightly, around your upper arm. You should be able to fit only one finger between your arm and the cuff. A device that stores blood pressure reading results. Do not choose a monitor that measures your blood pressure from your wrist or finger. Follow your health care provider's instructions for how to take your blood pressure. To use this form: Get one reading in the morning (a.m.) before you take any medicines. Get one reading in the evening (p.m.) before supper. Take at least two readings with each blood pressure check. This makes sure the results are correct. Wait 1-2 minutes between measurements. Write down the results in the spaces on this form. Repeat this once a week, or as told by your health care provider. Make a follow-up appointment with your health care provider to discuss the results. Blood pressure log Date: _______________________ a.m. _____________________(1st reading) _____________________(2nd reading) p.m. _____________________(1st reading) _____________________(2nd reading) Date: _______________________ a.m. _____________________(1st reading) _____________________(2nd reading) p.m. _____________________(1st reading) _____________________(2nd reading) Date: _______________________ a.m. _____________________(1st reading) _____________________(2nd reading) p.m. _____________________(1st reading) _____________________(2nd reading) Date: _______________________ a.m. _____________________(1st reading) _____________________(2nd reading) p.m. _____________________(1st reading) _____________________(2nd reading) Date: _______________________ a.m. _____________________(1st reading) _____________________(2nd reading) p.m. _____________________(1st reading) _____________________(2nd reading) This  information is not intended to replace advice given to you by your health care provider. Make sure you discuss any questions you have  with your health care provider. Document Revised: 06/18/2021 Document Reviewed: 06/18/2021 Elsevier Patient Education  2024 [employer].

## 2024-08-29 NOTE — Assessment & Plan Note (Signed)
-   BP is elevated today in the office but has historically been well-controlled per chart review. - I instructed the patient to check her blood pressure twice a day for 2 weeks and send the blood pressure log to me. - I counseled the patient on how to properly take her blood pressure at home. Prescribed blood pressure cuff 2-week blood pressure log to ensure that she does not have HTN.

## 2024-08-29 NOTE — Progress Notes (Signed)
 Cardiology Office Note:   Date:  08/29/2024  ID:  Shelly Castaneda, DOB September 12, 1966, MRN [id number] PCP: Colette Torrence GRADE, MD  [facility] Providers Cardiologist:  Georganna Archer, MD { Chief Complaint:  Chief Complaint  Patient presents with   Dizziness       History of Present Illness:   Shelly Castaneda is a 58 y.o. female with a PMH of hypothyroidism who presents as a new patient referral by Dr. Colette for the evaluation of an abnormal ECG.  The patient recently presented to the ED on 08/07/2024 with complaints of nausea, vomiting, chest pain, SOB and dizziness.  Evaluation in the ED was unremarkable with negative troponins, D-dimer, and normal CBC/BMP.  CXR showed no active cardiopulmonary disease.  An ECG was obtained which was interpreted as having abnormal R wave progression, so the patient was ultimately referred to cardiology for further evaluation.  Today the patient says that she did have 2 isolated episodes of dizziness 1 back during the summer when she was in [place] and nearly passed out while she was outside.  Another episode happened 1 month ago when she bent over and felt lightheaded.  She never had full syncope and she felt like she was probably dehydrated during these episodes.  Today she denies having any chest pain, SOB, PND, orthopnea, swelling, palpitations or syncope.  Her family history is notable for her brother who had an MI at 50 and ultimately needed 5 stents.  She has multiple siblings who all have high blood pressure.  She denies tobacco and illicit drug use.  Very infrequent EtOH use.  She lives in [facility] and has an adult daughter who is healthy.  She works in a industrial/product designer.  She does not exercise are diet.  No further concerns.   Past Medical History:  Diagnosis Date   Abnormal Pap smear of cervix 2001   treated with colpo biopsy and cryotherapy - normal since   Migraine with aura mid teens   Thyroid  disease 2004    hypothyroid     Studies Reviewed:    EKG:  EKG Interpretation Date/Time:  Wednesday August 29 2024 11:42:28 EST Ventricular Rate:  54 PR Interval:  162 QRS Duration:  76 QT Interval:  402 QTC Calculation: 381 R Axis:   3  Text Interpretation: Sinus bradycardia with sinus arrhythmia Septal infarct , age undetermined When compared with ECG of 07-Aug-2024 19:17, PREVIOUS ECG IS PRESENT Confirmed by Archer Georganna [phone]) on 08/29/2024 11:58:05 AM     Cardiac Studies & Procedures   ______________________________________________________________________________________________     ECHOCARDIOGRAM  PCV ECHOCARDIOGRAM COMPLETE 10/02/2019  Narrative Echocardiogram 10/02/2019: Left ventricle cavity is normal in size and wall thickness. Normal LV systolic function with EF 67%. Normal global wall motion. Normal diastolic filling pattern. No significant valvular abnormality. IVC is dilated with respiratory variation. This may suggest elevated right heart pressure          ______________________________________________________________________________________________      Risk Assessment/Calculations:          Physical Exam:     VS:  BP (!) 145/84   Pulse (!) 54   Ht 5' 6 (1.676 m)   Wt 171 lb (77.6 kg)   LMP 10/18/1993 (Approximate)   SpO2 97%   BMI 27.60 kg/m      Wt Readings from Last 3 Encounters:  08/09/24 168 lb 14.4 oz (76.6 kg)  04/02/24 166 lb (75.3 kg)  04/02/24 166 lb 6.4 oz (75.5 kg)  GEN: Well nourished, well developed, in no acute distress NECK: No JVD; No carotid bruits CARDIAC: RRR, no murmurs, rubs, gallops RESPIRATORY:  Clear to auscultation without rales, wheezing or rhonchi  ABDOMEN: Soft, non-tender, non-distended, normal bowel sounds EXTREMITIES:  Warm and well perfused, no edema; No deformity, 2+ radial pulses PSYCH: Normal mood and affect   Assessment & Plan Nonspecific abnormal electrocardiogram (ECG) (EKG) - I reviewed the  patient's EKG from the ED visit and it is not abnormal.  She does not have poor R wave progression and there is no signs of ischemia. -Repeat EKG in clinic today is also WNL.  Mixed hyperlipidemia - The patient's last LDL was 153 which is definitely higher than a goal of less than 100. -Her family history of having a brother who suffered an MI in his 7s is concerning given how young he was. -Ultimately I think the patient will probably need some lipid-lowering therapy; however, to better understand her risk I will order an LPA today. -If her LPA is normal then we can proceed with a CAC score. -Once I have this data I can better counsel her on lipid-lowering therapy. -I counseled her on the importance of eating a heart healthy diet which could lower her cholesterol. Lipoprotein a Consider CAC scan pending LP results Follow-up in 1 year Dizziness - She had 2 isolated episodes of dizziness that seemed orthostatic. - Echocardiogram in 2020 was unremarkable. -I encouraged adequate hydration and watchful waiting for now. -Can consider repeat echocardiogram if her symptoms persist despite conservative measures.  Elevated blood pressure reading in office without diagnosis of hypertension - BP is elevated today in the office but has historically been well-controlled per chart review. - I instructed the patient to check her blood pressure twice a day for 2 weeks and send the blood pressure log to me. - I counseled the patient on how to properly take her blood pressure at home. Prescribed blood pressure cuff 2-week blood pressure log to ensure that she does not have HTN.          This note was written with the assistance of a dictation microphone or AI dictation software. Please excuse any typos or grammatical errors.   Signed, Georganna Archer, MD 08/29/2024 8:04 AM    [facility]

## 2024-08-31 ENCOUNTER — Ambulatory Visit: Payer: Self-pay | Admitting: Student in an Organized Health Care Education/Training Program

## 2024-08-31 LAB — LIPOPROTEIN A (LPA): Lipoprotein (a): 15.1 nmol/L (ref <75.0)

## 2024-10-02 ENCOUNTER — Ambulatory Visit: Admitting: Family Medicine

## 2024-10-03 ENCOUNTER — Other Ambulatory Visit: Payer: Self-pay | Admitting: Family Medicine

## 2024-10-03 ENCOUNTER — Telehealth: Payer: Self-pay

## 2024-10-03 DIAGNOSIS — E7849 Other hyperlipidemia: Secondary | ICD-10-CM

## 2024-10-03 DIAGNOSIS — E039 Hypothyroidism, unspecified: Secondary | ICD-10-CM

## 2024-10-03 NOTE — Telephone Encounter (Signed)
 Copied from CRM #[id number]. Topic: Clinical - Request for Lab/Test Order >> Oct 03, 2024 10:20 AM Berwyn MATSU wrote: Reason for CRM: patient is coming in today to do labs and would like orders to be placed so she can get it done. Per patient she needs to have thyroid  and HLD checked.   May you please assist.

## 2024-10-04 ENCOUNTER — Ambulatory Visit: Admitting: Family Medicine

## 2024-11-02 ENCOUNTER — Ambulatory Visit: Payer: Self-pay | Admitting: Family Medicine

## 2024-11-02 LAB — LIPID PANEL
Chol/HDL Ratio: 3.3 ratio (ref 0.0–4.4)
Cholesterol, Total: 227 mg/dL — ABNORMAL HIGH (ref 100–199)
HDL: 69 mg/dL
LDL Chol Calc (NIH): 140 mg/dL — ABNORMAL HIGH (ref 0–99)
Triglycerides: 101 mg/dL (ref 0–149)
VLDL Cholesterol Cal: 18 mg/dL (ref 5–40)

## 2024-11-02 LAB — TSH+T4F+T3FREE
Free T4: 1.04 ng/dL (ref 0.82–1.77)
T3, Free: 2.3 pg/mL (ref 2.0–4.4)
TSH: 5.66 u[IU]/mL — ABNORMAL HIGH (ref 0.450–4.500)

## 2024-11-07 ENCOUNTER — Ambulatory Visit: Admitting: Family Medicine

## 2024-11-07 MED ORDER — LEVOTHYROXINE SODIUM 100 MCG PO TABS: 100.0000 ug | ORAL_TABLET | Freq: Every day | ORAL | 0 refills | Status: AC

## 2024-11-07 NOTE — Telephone Encounter (Signed)
 Attempted to contact patient but unfortunately was sent to voicemail. I have left a voice message requesting a call back to our office in order to reschedule her appointment.

## 2024-11-07 NOTE — Telephone Encounter (Signed)
 Spoke with Shelly Castaneda and was able to get her rescheduled for an in person appointment. Patient advised she has been having trouble with weight and medication SYNTHROID . I have expressed I will be refilling SYNTHROID  in order to have her reach her appointment date on 12/05/2024. Pt agreed and stated comprehension of medication compliance until appointment date.

## 2024-11-07 NOTE — Telephone Encounter (Signed)
 Copied from CRM [id number]. Topic: Appointments - Scheduling Inquiry for Clinic >> Nov 07, 2024 12:28 PM Shelly Castaneda wrote: Pt was not aware that her appointment was switched to a virtual appointment and is requesting a in office appointment as soon as possible as she will run out of her medication Called [facility] 3x no answer

## 2024-11-07 NOTE — Addendum Note (Signed)
 Addended by: OLIVA-AVELLANEDA, Zaylan Kissoon L on: 11/07/2024 01:57 PM   Modules accepted: Orders

## 2024-12-05 ENCOUNTER — Ambulatory Visit: Admitting: Family Medicine
